# Patient Record
Sex: Male | Born: 1960 | Race: Black or African American | Hispanic: No | Marital: Single | Smoking: Former smoker
Health system: Southern US, Community
[De-identification: ages and names within clinical notes are randomized; demographics above are authoritative.]

## PROBLEM LIST (undated history)

## (undated) DIAGNOSIS — E119 Type 2 diabetes mellitus without complications: Secondary | ICD-10-CM

## (undated) DIAGNOSIS — E785 Hyperlipidemia, unspecified: Secondary | ICD-10-CM

## (undated) DIAGNOSIS — Z720 Tobacco use: Secondary | ICD-10-CM

## (undated) DIAGNOSIS — I10 Essential (primary) hypertension: Secondary | ICD-10-CM

## (undated) HISTORY — DX: Essential (primary) hypertension: I10

## (undated) HISTORY — DX: Tobacco use: Z72.0

## (undated) HISTORY — DX: Hyperlipidemia, unspecified: E78.5

## (undated) HISTORY — PX: HERNIA REPAIR: SHX51

## (undated) HISTORY — DX: Type 2 diabetes mellitus without complications: E11.9

---

## 1994-06-03 DIAGNOSIS — E1165 Type 2 diabetes mellitus with hyperglycemia: Secondary | ICD-10-CM | POA: Insufficient documentation

## 2000-10-16 ENCOUNTER — Emergency Department (HOSPITAL_COMMUNITY): Admission: EM | Admit: 2000-10-16 | Discharge: 2000-10-16 | Payer: Self-pay | Admitting: Emergency Medicine

## 2001-02-21 ENCOUNTER — Encounter: Payer: Self-pay | Admitting: Emergency Medicine

## 2001-02-21 ENCOUNTER — Emergency Department (HOSPITAL_COMMUNITY): Admission: EM | Admit: 2001-02-21 | Discharge: 2001-02-21 | Payer: Self-pay | Admitting: Emergency Medicine

## 2002-04-29 ENCOUNTER — Encounter: Payer: Self-pay | Admitting: Otolaryngology

## 2002-04-29 ENCOUNTER — Observation Stay (HOSPITAL_COMMUNITY): Admission: EM | Admit: 2002-04-29 | Discharge: 2002-04-30 | Payer: Self-pay

## 2002-06-28 ENCOUNTER — Ambulatory Visit (HOSPITAL_COMMUNITY): Admission: RE | Admit: 2002-06-28 | Discharge: 2002-06-28 | Payer: Self-pay | Admitting: Internal Medicine

## 2002-06-28 ENCOUNTER — Encounter: Payer: Self-pay | Admitting: Internal Medicine

## 2003-07-19 ENCOUNTER — Emergency Department (HOSPITAL_COMMUNITY): Admission: EM | Admit: 2003-07-19 | Discharge: 2003-07-19 | Payer: Self-pay | Admitting: Family Medicine

## 2004-10-26 ENCOUNTER — Emergency Department (HOSPITAL_COMMUNITY): Admission: EM | Admit: 2004-10-26 | Discharge: 2004-10-26 | Payer: Self-pay | Admitting: Family Medicine

## 2004-11-13 ENCOUNTER — Emergency Department (HOSPITAL_COMMUNITY): Admission: EM | Admit: 2004-11-13 | Discharge: 2004-11-13 | Payer: Self-pay | Admitting: Family Medicine

## 2005-03-19 ENCOUNTER — Emergency Department (HOSPITAL_COMMUNITY): Admission: EM | Admit: 2005-03-19 | Discharge: 2005-03-19 | Payer: Self-pay | Admitting: Family Medicine

## 2005-06-16 ENCOUNTER — Emergency Department (HOSPITAL_COMMUNITY): Admission: EM | Admit: 2005-06-16 | Discharge: 2005-06-16 | Payer: Self-pay | Admitting: Emergency Medicine

## 2005-09-03 ENCOUNTER — Emergency Department (HOSPITAL_COMMUNITY): Admission: EM | Admit: 2005-09-03 | Discharge: 2005-09-03 | Payer: Self-pay | Admitting: Family Medicine

## 2005-11-05 ENCOUNTER — Ambulatory Visit: Payer: Self-pay | Admitting: Family Medicine

## 2005-11-06 ENCOUNTER — Ambulatory Visit: Payer: Self-pay | Admitting: *Deleted

## 2006-02-20 ENCOUNTER — Emergency Department (HOSPITAL_COMMUNITY): Admission: EM | Admit: 2006-02-20 | Discharge: 2006-02-20 | Payer: Self-pay | Admitting: Emergency Medicine

## 2006-04-08 ENCOUNTER — Emergency Department (HOSPITAL_COMMUNITY): Admission: EM | Admit: 2006-04-08 | Discharge: 2006-04-08 | Payer: Self-pay | Admitting: Family Medicine

## 2006-04-09 ENCOUNTER — Emergency Department (HOSPITAL_COMMUNITY): Admission: EM | Admit: 2006-04-09 | Discharge: 2006-04-09 | Payer: Self-pay | Admitting: Emergency Medicine

## 2006-05-28 ENCOUNTER — Ambulatory Visit: Payer: Self-pay | Admitting: Internal Medicine

## 2006-05-28 ENCOUNTER — Encounter (INDEPENDENT_AMBULATORY_CARE_PROVIDER_SITE_OTHER): Payer: Self-pay | Admitting: Infectious Diseases

## 2006-05-28 LAB — CONVERTED CEMR LAB
ALT: 22 units/L (ref 0–53)
AST: 9 units/L (ref 0–37)
Albumin: 4.2 g/dL (ref 3.5–5.2)
Alkaline Phosphatase: 152 units/L — ABNORMAL HIGH (ref 39–117)
BUN: 13 mg/dL (ref 6–23)
CO2: 27 meq/L (ref 19–32)
Calcium: 9.9 mg/dL (ref 8.4–10.5)
Chloride: 99 meq/L (ref 96–112)
Creatinine, Ser: 0.86 mg/dL (ref 0.40–1.50)
Creatinine, Urine: 50.1 mg/dL
Glucose, Bld: 398 mg/dL — ABNORMAL HIGH (ref 70–99)
HCT: 44.3 % (ref 41.0–49.0)
Hemoglobin: 14.3 g/dL (ref 13.9–16.8)
MCHC: 32.3 g/dL — ABNORMAL LOW (ref 33.1–35.4)
MCV: 70.4 fL — ABNORMAL LOW (ref 78.8–100.0)
Microalb Creat Ratio: 65.7 mg/g — ABNORMAL HIGH (ref 0.0–30.0)
Microalb, Ur: 3.29 mg/dL — ABNORMAL HIGH (ref 0.00–1.89)
Platelets: 342 10*3/uL (ref 152–374)
Potassium: 4.1 meq/L (ref 3.5–5.3)
RBC: 6.29 M/uL — ABNORMAL HIGH (ref 4.20–5.50)
RDW: 14.2 % (ref 11.5–15.3)
Sodium: 135 meq/L (ref 135–145)
Total Bilirubin: 0.5 mg/dL (ref 0.3–1.2)
Total Protein: 7.5 g/dL (ref 6.0–8.3)
WBC: 7.7 10*3/uL (ref 3.7–10.0)

## 2006-06-03 DIAGNOSIS — I10 Essential (primary) hypertension: Secondary | ICD-10-CM | POA: Insufficient documentation

## 2006-06-16 DIAGNOSIS — Z87891 Personal history of nicotine dependence: Secondary | ICD-10-CM | POA: Insufficient documentation

## 2006-06-16 DIAGNOSIS — L255 Unspecified contact dermatitis due to plants, except food: Secondary | ICD-10-CM | POA: Insufficient documentation

## 2006-07-15 ENCOUNTER — Emergency Department (HOSPITAL_COMMUNITY): Admission: EM | Admit: 2006-07-15 | Discharge: 2006-07-15 | Payer: Self-pay | Admitting: Emergency Medicine

## 2006-07-22 ENCOUNTER — Ambulatory Visit: Payer: Self-pay | Admitting: Internal Medicine

## 2006-07-22 DIAGNOSIS — M25519 Pain in unspecified shoulder: Secondary | ICD-10-CM | POA: Insufficient documentation

## 2006-07-22 DIAGNOSIS — T783XXA Angioneurotic edema, initial encounter: Secondary | ICD-10-CM | POA: Insufficient documentation

## 2006-07-22 LAB — CONVERTED CEMR LAB: Blood Glucose, Fingerstick: 293

## 2006-07-23 ENCOUNTER — Encounter (INDEPENDENT_AMBULATORY_CARE_PROVIDER_SITE_OTHER): Payer: Self-pay | Admitting: Infectious Diseases

## 2006-07-23 ENCOUNTER — Ambulatory Visit: Payer: Self-pay | Admitting: Hospitalist

## 2006-07-23 LAB — CONVERTED CEMR LAB
ALT: 22 units/L (ref 0–53)
AST: 13 units/L (ref 0–37)
Albumin: 4.4 g/dL (ref 3.5–5.2)
Alkaline Phosphatase: 97 units/L (ref 39–117)
BUN: 13 mg/dL (ref 6–23)
CO2: 27 meq/L (ref 19–32)
Calcium: 9.6 mg/dL (ref 8.4–10.5)
Chloride: 97 meq/L (ref 96–112)
Cholesterol: 221 mg/dL — ABNORMAL HIGH (ref 0–200)
Creatinine, Ser: 0.82 mg/dL (ref 0.40–1.50)
Glucose, Bld: 230 mg/dL — ABNORMAL HIGH (ref 70–99)
HCT: 42.7 % (ref 39.0–52.0)
HDL: 38 mg/dL — ABNORMAL LOW (ref >39)
Hemoglobin: 13.8 g/dL (ref 13.0–17.0)
LDL Cholesterol: 143 mg/dL — ABNORMAL HIGH (ref 0–99)
MCHC: 32.3 g/dL (ref 30.0–36.0)
MCV: 69.9 fL — ABNORMAL LOW (ref 78.0–100.0)
Platelets: 317 10*3/uL (ref 150–400)
Potassium: 4.5 meq/L (ref 3.5–5.3)
RBC: 6.11 M/uL — ABNORMAL HIGH (ref 4.22–5.81)
RDW: 14.3 % — ABNORMAL HIGH (ref 11.5–14.0)
Sodium: 135 meq/L (ref 135–145)
Total Bilirubin: 0.5 mg/dL (ref 0.3–1.2)
Total CHOL/HDL Ratio: 5.8
Total Protein: 7.6 g/dL (ref 6.0–8.3)
Triglycerides: 198 mg/dL — ABNORMAL HIGH (ref <150)
VLDL: 40 mg/dL (ref 0–40)
WBC: 8.5 10*3/uL (ref 4.0–10.5)

## 2006-09-03 ENCOUNTER — Emergency Department (HOSPITAL_COMMUNITY): Admission: EM | Admit: 2006-09-03 | Discharge: 2006-09-03 | Payer: Self-pay | Admitting: Family Medicine

## 2006-09-30 ENCOUNTER — Encounter (INDEPENDENT_AMBULATORY_CARE_PROVIDER_SITE_OTHER): Payer: Self-pay | Admitting: Internal Medicine

## 2006-09-30 ENCOUNTER — Ambulatory Visit: Payer: Self-pay | Admitting: Internal Medicine

## 2006-09-30 DIAGNOSIS — L293 Anogenital pruritus, unspecified: Secondary | ICD-10-CM | POA: Insufficient documentation

## 2006-09-30 LAB — CONVERTED CEMR LAB
ALT: 33 units/L (ref 0–53)
AST: 18 units/L (ref 0–37)
Albumin: 4.5 g/dL (ref 3.5–5.2)
Alkaline Phosphatase: 92 units/L (ref 39–117)
BUN: 13 mg/dL (ref 6–23)
Blood Glucose, Fingerstick: 234
CO2: 25 meq/L (ref 19–32)
Calcium: 10.2 mg/dL (ref 8.4–10.5)
Chloride: 101 meq/L (ref 96–112)
Creatinine, Ser: 0.82 mg/dL (ref 0.40–1.50)
Glucose, Bld: 145 mg/dL — ABNORMAL HIGH (ref 70–99)
HCT: 44.8 % (ref 39.0–52.0)
Hemoglobin: 14.2 g/dL (ref 13.0–17.0)
Hgb A1c MFr Bld: 8.5 %
MCHC: 31.7 g/dL (ref 30.0–36.0)
MCV: 72 fL — ABNORMAL LOW (ref 78.0–100.0)
Platelets: 344 10*3/uL (ref 150–400)
Potassium: 4.4 meq/L (ref 3.5–5.3)
RBC: 6.22 M/uL — ABNORMAL HIGH (ref 4.22–5.81)
RDW: 14.5 % — ABNORMAL HIGH (ref 11.5–14.0)
Sodium: 141 meq/L (ref 135–145)
Total Bilirubin: 0.5 mg/dL (ref 0.3–1.2)
Total Protein: 8.1 g/dL (ref 6.0–8.3)
WBC: 6.7 10*3/uL (ref 4.0–10.5)

## 2006-10-03 ENCOUNTER — Encounter (INDEPENDENT_AMBULATORY_CARE_PROVIDER_SITE_OTHER): Payer: Self-pay | Admitting: Internal Medicine

## 2006-10-07 ENCOUNTER — Ambulatory Visit: Payer: Self-pay | Admitting: Internal Medicine

## 2006-10-29 ENCOUNTER — Telehealth (INDEPENDENT_AMBULATORY_CARE_PROVIDER_SITE_OTHER): Payer: Self-pay | Admitting: *Deleted

## 2006-12-26 ENCOUNTER — Telehealth (INDEPENDENT_AMBULATORY_CARE_PROVIDER_SITE_OTHER): Payer: Self-pay | Admitting: Internal Medicine

## 2006-12-31 ENCOUNTER — Encounter (INDEPENDENT_AMBULATORY_CARE_PROVIDER_SITE_OTHER): Payer: Self-pay | Admitting: Infectious Diseases

## 2006-12-31 ENCOUNTER — Encounter (INDEPENDENT_AMBULATORY_CARE_PROVIDER_SITE_OTHER): Payer: Self-pay | Admitting: Internal Medicine

## 2006-12-31 ENCOUNTER — Ambulatory Visit: Payer: Self-pay | Admitting: *Deleted

## 2006-12-31 ENCOUNTER — Ambulatory Visit (HOSPITAL_COMMUNITY): Admission: RE | Admit: 2006-12-31 | Discharge: 2006-12-31 | Payer: Self-pay | Admitting: *Deleted

## 2006-12-31 DIAGNOSIS — E785 Hyperlipidemia, unspecified: Secondary | ICD-10-CM | POA: Insufficient documentation

## 2006-12-31 DIAGNOSIS — S91309A Unspecified open wound, unspecified foot, initial encounter: Secondary | ICD-10-CM | POA: Insufficient documentation

## 2006-12-31 DIAGNOSIS — R000 Tachycardia, unspecified: Secondary | ICD-10-CM | POA: Insufficient documentation

## 2006-12-31 DIAGNOSIS — B353 Tinea pedis: Secondary | ICD-10-CM | POA: Insufficient documentation

## 2006-12-31 LAB — CONVERTED CEMR LAB
Blood Glucose, Fingerstick: 219
CO2: 25 meq/L (ref 19–32)
Creatinine, Ser: 0.9 mg/dL (ref 0.40–1.50)
Free T4: 1.34 ng/dL (ref 0.89–1.80)
Glucose, Bld: 189 mg/dL — ABNORMAL HIGH (ref 70–99)
Hgb A1c MFr Bld: 7.8 %
Microalb Creat Ratio: 35.3 mg/g — ABNORMAL HIGH (ref 0.0–30.0)
TSH: 1.093 microintl units/mL (ref 0.350–5.50)
Total Bilirubin: 0.6 mg/dL (ref 0.3–1.2)

## 2007-01-02 ENCOUNTER — Encounter (INDEPENDENT_AMBULATORY_CARE_PROVIDER_SITE_OTHER): Payer: Self-pay | Admitting: Internal Medicine

## 2007-04-24 ENCOUNTER — Encounter: Payer: Self-pay | Admitting: *Deleted

## 2007-04-28 ENCOUNTER — Telehealth: Payer: Self-pay | Admitting: *Deleted

## 2007-05-11 ENCOUNTER — Telehealth (INDEPENDENT_AMBULATORY_CARE_PROVIDER_SITE_OTHER): Payer: Self-pay | Admitting: Infectious Diseases

## 2007-05-18 ENCOUNTER — Telehealth (INDEPENDENT_AMBULATORY_CARE_PROVIDER_SITE_OTHER): Payer: Self-pay | Admitting: Infectious Diseases

## 2007-06-10 ENCOUNTER — Encounter (INDEPENDENT_AMBULATORY_CARE_PROVIDER_SITE_OTHER): Payer: Self-pay | Admitting: Infectious Diseases

## 2007-07-22 DIAGNOSIS — E113299 Type 2 diabetes mellitus with mild nonproliferative diabetic retinopathy without macular edema, unspecified eye: Secondary | ICD-10-CM | POA: Insufficient documentation

## 2007-07-22 DIAGNOSIS — E11329 Type 2 diabetes mellitus with mild nonproliferative diabetic retinopathy without macular edema: Secondary | ICD-10-CM | POA: Insufficient documentation

## 2007-10-28 ENCOUNTER — Ambulatory Visit: Payer: Self-pay | Admitting: *Deleted

## 2007-10-28 DIAGNOSIS — E1169 Type 2 diabetes mellitus with other specified complication: Secondary | ICD-10-CM | POA: Insufficient documentation

## 2007-10-28 LAB — CONVERTED CEMR LAB
Blood Glucose, Fingerstick: 129
Hgb A1c MFr Bld: 9.2 %

## 2007-10-29 ENCOUNTER — Telehealth (INDEPENDENT_AMBULATORY_CARE_PROVIDER_SITE_OTHER): Payer: Self-pay | Admitting: *Deleted

## 2007-11-04 ENCOUNTER — Ambulatory Visit: Payer: Self-pay | Admitting: Infectious Disease

## 2007-11-11 ENCOUNTER — Ambulatory Visit: Payer: Self-pay | Admitting: *Deleted

## 2007-11-11 ENCOUNTER — Encounter (INDEPENDENT_AMBULATORY_CARE_PROVIDER_SITE_OTHER): Payer: Self-pay | Admitting: Internal Medicine

## 2007-11-11 LAB — CONVERTED CEMR LAB
ALT: 38 units/L (ref 0–53)
Alkaline Phosphatase: 76 units/L (ref 39–117)
Glucose, Bld: 186 mg/dL — ABNORMAL HIGH (ref 70–99)
LDL Cholesterol: 74 mg/dL (ref 0–99)
MCHC: 30.3 g/dL (ref 30.0–36.0)
MCV: 73.3 fL — ABNORMAL LOW (ref 78.0–100.0)
Microalb, Ur: 1.26 mg/dL (ref 0.00–1.89)
Platelets: 287 10*3/uL (ref 150–400)
Sodium: 138 meq/L (ref 135–145)
Total Bilirubin: 0.4 mg/dL (ref 0.3–1.2)
Total Protein: 7.8 g/dL (ref 6.0–8.3)
Triglycerides: 155 mg/dL — ABNORMAL HIGH (ref <150)
VLDL: 31 mg/dL (ref 0–40)

## 2007-12-04 ENCOUNTER — Ambulatory Visit: Payer: Self-pay | Admitting: *Deleted

## 2007-12-04 ENCOUNTER — Telehealth (INDEPENDENT_AMBULATORY_CARE_PROVIDER_SITE_OTHER): Payer: Self-pay | Admitting: Infectious Diseases

## 2008-03-23 ENCOUNTER — Ambulatory Visit: Payer: Self-pay | Admitting: Internal Medicine

## 2008-03-23 ENCOUNTER — Encounter: Payer: Self-pay | Admitting: Internal Medicine

## 2008-03-23 DIAGNOSIS — A5601 Chlamydial cystitis and urethritis: Secondary | ICD-10-CM | POA: Insufficient documentation

## 2008-04-14 ENCOUNTER — Telehealth (INDEPENDENT_AMBULATORY_CARE_PROVIDER_SITE_OTHER): Payer: Self-pay | Admitting: *Deleted

## 2008-11-02 ENCOUNTER — Telehealth: Payer: Self-pay | Admitting: Internal Medicine

## 2008-12-07 ENCOUNTER — Ambulatory Visit: Payer: Self-pay | Admitting: Internal Medicine

## 2008-12-07 ENCOUNTER — Encounter: Payer: Self-pay | Admitting: Internal Medicine

## 2008-12-07 DIAGNOSIS — K0381 Cracked tooth: Secondary | ICD-10-CM | POA: Insufficient documentation

## 2008-12-07 LAB — CONVERTED CEMR LAB: Blood Glucose, Fingerstick: 301

## 2009-04-18 ENCOUNTER — Emergency Department (HOSPITAL_COMMUNITY): Admission: EM | Admit: 2009-04-18 | Discharge: 2009-04-18 | Payer: Self-pay | Admitting: Emergency Medicine

## 2009-05-08 ENCOUNTER — Encounter: Payer: Self-pay | Admitting: Internal Medicine

## 2009-06-01 ENCOUNTER — Telehealth: Payer: Self-pay | Admitting: Internal Medicine

## 2009-06-14 ENCOUNTER — Ambulatory Visit: Payer: Self-pay | Admitting: Internal Medicine

## 2009-06-14 ENCOUNTER — Encounter (INDEPENDENT_AMBULATORY_CARE_PROVIDER_SITE_OTHER): Payer: Self-pay | Admitting: Internal Medicine

## 2009-06-14 DIAGNOSIS — A4902 Methicillin resistant Staphylococcus aureus infection, unspecified site: Secondary | ICD-10-CM | POA: Insufficient documentation

## 2009-06-22 LAB — CONVERTED CEMR LAB
Cholesterol: 209 mg/dL — ABNORMAL HIGH (ref 0–200)
Microalb, Ur: 7.82 mg/dL — ABNORMAL HIGH (ref 0.00–1.89)
Triglycerides: 259 mg/dL — ABNORMAL HIGH (ref <150)
VLDL: 52 mg/dL — ABNORMAL HIGH (ref 0–40)

## 2009-06-30 ENCOUNTER — Telehealth (INDEPENDENT_AMBULATORY_CARE_PROVIDER_SITE_OTHER): Payer: Self-pay | Admitting: *Deleted

## 2009-07-12 ENCOUNTER — Ambulatory Visit: Payer: Self-pay | Admitting: Internal Medicine

## 2009-08-15 ENCOUNTER — Telehealth (INDEPENDENT_AMBULATORY_CARE_PROVIDER_SITE_OTHER): Payer: Self-pay | Admitting: *Deleted

## 2009-08-18 ENCOUNTER — Telehealth: Payer: Self-pay | Admitting: Internal Medicine

## 2009-10-24 ENCOUNTER — Telehealth (INDEPENDENT_AMBULATORY_CARE_PROVIDER_SITE_OTHER): Payer: Self-pay | Admitting: *Deleted

## 2009-10-24 ENCOUNTER — Encounter (INDEPENDENT_AMBULATORY_CARE_PROVIDER_SITE_OTHER): Payer: Self-pay | Admitting: *Deleted

## 2009-11-06 ENCOUNTER — Telehealth: Payer: Self-pay | Admitting: Internal Medicine

## 2010-03-23 ENCOUNTER — Telehealth: Payer: Self-pay | Admitting: Internal Medicine

## 2010-07-24 NOTE — Letter (Signed)
Summary: [facility] RECALL LETTER  All     ,     Phone:   Fax:     10/24/2009   Rodney Mcgee [address] [place], [place]  [address]   Dear  [name],   You are due to follow-up with a doctor at the [facility].  We have been unable to contact you by phone.  If you would like to schedule a visit, please call [phone].  If you are receiving your health care somewhere else, please call us and we will take your name off our patient list.  Healthy regards,  Rodney Mcgee, Director [facility]

## 2010-07-24 NOTE — Letter (Signed)
Summary: [employer] Assistance : Diabetes Care   [employer] Assistance : Diabetes Care   Imported By: Florinda Marker 07/13/2009 15:17:31  _____________________________________________________________________  External Attachment:    Type:   Image     Comment:   External Document

## 2010-07-24 NOTE — Progress Notes (Signed)
Summary: med refill/gp  Phone Note Refill Request Message from:  Fax from Pharmacy on August 18, 2009 4:25 PM  Refills Requested: Medication #1:  GLYBURIDE 5 MG TABS Take 2 tablets by mouth two times a day   Last Refilled: 07/12/2009  Method Requested: Electronic Initial call taken by: Chinita Pester RN,  August 18, 2009 4:25 PM  Follow-up for Phone Call        pls make pt appt for followup.     Prescriptions: GLYBURIDE 5 MG TABS (GLYBURIDE) Take 2 tablets by mouth two times a day  #60 x 1   Entered and Authorized by:   Clerance Lav MD   Signed by:   Clerance Lav MD on 08/18/2009   Method used:   Electronically to        [facility] [phone]* (retail)       [address]       [place], [place]  [address]       Ph: [phone]       Fax: [phone]   RxID:   [id number]   Appended Document: med refill/gp Flag sent to [facility] for an appt.

## 2010-07-24 NOTE — Progress Notes (Signed)
Summary: reschdeuled appmnt for diabetes self management training/dmr  Phone Note Outgoing Call   Call placed by: Jamison Neighbor RD,CDE,  June 30, 2009 2:19 PM Summary of Call: called patient about missed appintment for diabetes self management training.  was told I could call his cell # [phone]. Patient rescheduled for 07/12/09 @ 10 am.

## 2010-07-24 NOTE — Progress Notes (Signed)
Summary: refill/gg  Phone Note Refill Request  on March 23, 2010 4:32 PM  Refills Requested: Medication #1:  GLYBURIDE 5 MG TABS Take 2 tablets by mouth two times a day   Last Refilled: 02/08/2010 Will you change quantity to # 120?   Method Requested: Electronic Initial call taken by: Merrie Roof RN,  March 23, 2010 4:33 PM  Follow-up for Phone Call        Please refer to refill request addressed by Dr. Sherryll Burger on 11/06/09.  Pt needs appointment.  Follow-up by: Mariea Stable MD,  March 29, 2010 4:34 PM    Prescriptions: GLYBURIDE 5 MG TABS (GLYBURIDE) Take 2 tablets by mouth two times a day  #120 x 3   Entered and Authorized by:   Clerance Lav MD   Signed by:   Clerance Lav MD on 03/30/2010   Method used:   Electronically to        [facility]  [address] [phone]* (retail)       [address]       [place], [place]  [address]       Ph: [phone]       Fax: [phone]   RxID:   [id number]

## 2010-07-24 NOTE — Progress Notes (Signed)
Summary: Refill/gh  Phone Note Refill Request Message from:  Patient on Nov 06, 2009 3:50 PM  Refills Requested: Medication #1:  GLYBURIDE 5 MG TABS Take 2 tablets by mouth two times a day   Last Refilled: 08/18/2009  Method Requested: Electronic Initial call taken by: Angelina Ok RN,  Nov 06, 2009 3:50 PM  Follow-up for Phone Call        pt needs appt. if pt is calling for refill, they must have reachable number. I will refill for last time, if pt is not coming back for appt, I will stop refills.  Follow-up by: Clerance Lav MD,  Nov 09, 2009 9:32 AM    Prescriptions: GLYBURIDE 5 MG TABS (GLYBURIDE) Take 2 tablets by mouth two times a day  #60 x 3   Entered and Authorized by:   Clerance Lav MD   Signed by:   Clerance Lav MD on 11/09/2009   Method used:   Electronically to        [facility] [phone]* (retail)       [address]       [place], [place]  [address]       Ph: [phone]       Fax: [phone]   RxID:   [id number]

## 2010-07-24 NOTE — Progress Notes (Signed)
  Phone Note Outgoing Call   Call placed by: Jamison Neighbor RD,CDE,  Oct 24, 2009 10:34 AM Summary of Call: phone number listed is not in service. will mail letter reminding patient he is due for an appointment with doctor.

## 2010-07-24 NOTE — Assessment & Plan Note (Signed)
Summary: diabetes/rescheduled from earlier apptmet/dmr   Preventive Screening-Counseling & Management  Alcohol-Tobacco     Smoking Status: Quit < 1 Year  Allergies: 1)  ! Pcn 2)  ! Asa 3)  ! * Lisinopril  Social History: Smoking Status:  Quit < 1 Year   Complete Medication List: 1)  Metformin Hcl 1000 Mg Tabs (Metformin hcl) .... Take 1 tablet by mouth two times a day 2)  Glyburide 5 Mg Tabs (Glyburide) .... Take 2 tablets by mouth two times a day 3)  Chlorhexidine Gluconate 2 % Liqd (Chlorhexidine gluconate) .... Apply solution from neck to toes leave on for one minute and then rinse for 7 [place]. 4)  Mupirocin 2 % Oint (Mupirocin) .... Apply to both nostrils two times a day for 7 [place]. 5)  Pravachol 40 Mg Tabs (Pravastatin sodium) .... Take one tablet daily to decrease cholesterol.  Other Orders: DSMT(Medicare) Individual, 30 Minutes (G0108)  Diabetes Self Management Training  PCP: Clerance Lav MD Date diagnosed with diabetes: 06/25/1999 Diabetes Type: Type 2 non-insulin Current smoking Status: Quit < 1 Year  Assessment Work Hours: Not currently working Daily activities: trying to walk daily Sources of Support: children and spouse Special needs or Barriers: finaincil  Potential Barriers  Economic/Supplies  Needs review/assistance  Coping with Diabetes Feelings about Diabetes: discussed that he felt he was in denial, but is ready to tkae care of hsi diabetes now  Diabetes Medications:  Comments: gave patient a freestyle lite meter today- CBG was > 350 in office. patient has been out of both his diabetes medications since last week and tells me he has also made many changes in his diet to decrease sugar and starch. . plans to get them refiled today and start on them. says that if his blood sugars arenot downl by next week- he'll call us.advised him to purchase [employer] meter if he does not qualify for patient assistance for this meter. Discussed consideration of insulin  today- one injection of Nph at bedtime or ReliOn Humulin 70/30 before dinner may help pills work better and patient to affordably reach target blood sugars    Monitoring Self monitoring blood glucose 1 time a day Name of Meter  Freestyle Lite  Recent Episodes of: Requiring Help from another person  Hyperglycemia : Yes Hypoglycemia: No Severe Hypoglycemia : No     Estimated /Usual Carb Intake Breakfast # of Carbs/Grams cereal and 1% milk or egg and grits and sausage Lunch # of Carbs/Grams sadwich and diet soda or water Midafternoon # of Carbs/Grams fruit or yogurt Dinner # of Carbs/Grams meat- not fired, starch and vegetable- if he eats bread- he leaves starch off  Nutrition assessment Who does the food shopping? Your spouse Who does the cooking?  Your spouse What beverages do you drink?  water, diet soda Do you read food labels?                                                                          No- not often , but sometimes Biggest challenge to eating healthy: Eating too little Diabetes Disease Process  Discussed today State own type of diabetes: Demonstrates competencyState diabetes is treated by meal plan-exercise-medication-monitoring-education: Demonstrates competency Medications State name-action-dose-duration-side effects-and time  to take medication: Needs review/assistanceDescribe safe needle/lancet disposal: Demonstrates competency Nutritional Management Identify what foods most often affect blood glucose: Needs review/assistance    Verbalize importance of controlling food portions: Demonstrates competencyState importance of spacing and not omitting meals and snacks: Demonstrates competencyState changes planned for home meals/snacks: Needs review/assistance    Monitoring State purpose and frequency of monitoring BG-ketones-HgbA1C  : Needs review/assistance   Perform glucose monitoring/ketone testing and record results correctly: Demonstrates competencyState  target blood glucose and HgbA1C goals: Needs review/assistance Complications State the causes-signs and symptoms and prevention of Hyperglycemia: Needs review/assistance   Explain proper treatment of hyperglycemia: Needs review/assistance   Glucose control and the development/prevention of long-term complications: Demonstrates competency Exercise States importance of exercise: Demonstrates competencyStates effect of exercise on blood glucose: Demonstrates competencyVerbalizes safety measures for exercise related to diabetes: Needs review/assistance Lifestyle changes:Goal setting and Problem solving State benefits of making appropriate lifestyle changes: Demonstrates competencyIdentify lifestyle behaviors that need to change: Demonstrates competencyDevelop strategies to reduce risk factors: Needs review/assistanceIdentify Family/SO role in managing diabetes: Demonstrates competency    Psychosocial Adjustment State three common feelings that might be experienced when learning to cope with diabetes: Demonstrates competency   Identify two methods to cope with these feelings: Needs review/assistanceDiabetes Management Education Done: 07/05/2009    BEHAVIORAL GOALS INITIAL Incorporating physical activity into lifestyle: walk 30 minutes every day Monitoring blood glucose levels daily: check blood sugar daily for next 10 [place]        suggest inulin initiation as soon as patient ready and willing. did not want ot schedule a follow-up with CDE. advised 4 weeks, but patient is uninsured. could refer him to [facility]. Patient givien [employer] phone number ot follow up on test strip assistance application.  Diabetes Self Management Support: wife, clinic staff Follow-up:receommended 4 weeks

## 2010-07-24 NOTE — Progress Notes (Signed)
Summary: diabetes support and follow-up/dmr  Phone Note Outgoing Call   Call placed by: Jamison Neighbor RD,CDE,  August 15, 2009 3:16 PM Summary of Call: called to follow-upon blood sugars. patient is due to follow-up with Doctor in March. phone number is incorrect. will  request patient be contactedl to schedule appointment.

## 2010-07-24 NOTE — Progress Notes (Signed)
Summary: Refill/gh  Phone Note Refill Request Message from:  Fax from Pharmacy on Nov 06, 2009 12:41 PM  Refills Requested: Medication #1:  METFORMIN HCL 1000 MG  TABS Take 1 tablet by mouth two times a day   Last Refilled: 09/16/2009  Method Requested: Electronic Initial call taken by: Angelina Ok RN,  Nov 06, 2009 12:41 PM  Follow-up for Phone Call       Follow-up by: Clerance Lav MD,  Nov 06, 2009 12:52 PM    Prescriptions: METFORMIN HCL 1000 MG  TABS (METFORMIN HCL) Take 1 tablet by mouth two times a day  #60 x 3   Entered and Authorized by:   Clerance Lav MD   Signed by:   Clerance Lav MD on 11/06/2009   Method used:   Electronically to        [facility] [phone]* (retail)       [address]       [place], [place]  [address]       Ph: [phone]       Fax: [phone]   RxID:   [id number]

## 2010-10-01 ENCOUNTER — Encounter: Payer: Self-pay | Admitting: Internal Medicine

## 2010-10-01 ENCOUNTER — Ambulatory Visit (INDEPENDENT_AMBULATORY_CARE_PROVIDER_SITE_OTHER): Payer: Self-pay | Admitting: Internal Medicine

## 2010-10-01 DIAGNOSIS — Z23 Encounter for immunization: Secondary | ICD-10-CM

## 2010-10-01 DIAGNOSIS — N529 Male erectile dysfunction, unspecified: Secondary | ICD-10-CM

## 2010-10-01 DIAGNOSIS — E1169 Type 2 diabetes mellitus with other specified complication: Secondary | ICD-10-CM

## 2010-10-01 DIAGNOSIS — I10 Essential (primary) hypertension: Secondary | ICD-10-CM

## 2010-10-01 DIAGNOSIS — J302 Other seasonal allergic rhinitis: Secondary | ICD-10-CM | POA: Insufficient documentation

## 2010-10-01 DIAGNOSIS — N521 Erectile dysfunction due to diseases classified elsewhere: Secondary | ICD-10-CM | POA: Insufficient documentation

## 2010-10-01 DIAGNOSIS — N485 Ulcer of penis: Secondary | ICD-10-CM | POA: Insufficient documentation

## 2010-10-01 DIAGNOSIS — E119 Type 2 diabetes mellitus without complications: Secondary | ICD-10-CM

## 2010-10-01 DIAGNOSIS — E785 Hyperlipidemia, unspecified: Secondary | ICD-10-CM

## 2010-10-01 DIAGNOSIS — N4889 Other specified disorders of penis: Secondary | ICD-10-CM

## 2010-10-01 DIAGNOSIS — J309 Allergic rhinitis, unspecified: Secondary | ICD-10-CM

## 2010-10-01 LAB — COMPREHENSIVE METABOLIC PANEL
AST: 19 U/L (ref 0–37)
BUN: 12 mg/dL (ref 6–23)
CO2: 25 mEq/L (ref 19–32)
Calcium: 10 mg/dL (ref 8.4–10.5)
Chloride: 99 mEq/L (ref 96–112)
Creat: 0.8 mg/dL (ref 0.40–1.50)

## 2010-10-01 LAB — GLUCOSE, CAPILLARY: Glucose-Capillary: 301 mg/dL — ABNORMAL HIGH (ref 70–99)

## 2010-10-01 LAB — RPR

## 2010-10-01 MED ORDER — METFORMIN HCL 1000 MG PO TABS: 1000.0000 mg | ORAL_TABLET | Freq: Two times a day (BID) | ORAL | Status: DC

## 2010-10-01 MED ORDER — CETIRIZINE-PSEUDOEPHEDRINE ER 5-120 MG PO TB12: 1.0000 | ORAL_TABLET | Freq: Two times a day (BID) | ORAL | Status: AC

## 2010-10-01 MED ORDER — GLIPIZIDE 10 MG PO TABS: 10.0000 mg | ORAL_TABLET | Freq: Two times a day (BID) | ORAL | Status: DC

## 2010-10-01 MED ORDER — PRAVASTATIN SODIUM 40 MG PO TABS: 40.0000 mg | ORAL_TABLET | Freq: Every day | ORAL | Status: DC

## 2010-10-01 MED ORDER — SILDENAFIL CITRATE 50 MG PO TABS: 50.0000 mg | ORAL_TABLET | ORAL | Status: DC | PRN

## 2010-10-01 MED ORDER — MUPIROCIN CALCIUM 2 % EX CREA: TOPICAL_CREAM | CUTANEOUS | Status: AC

## 2010-10-01 MED ORDER — DOXYCYCLINE HYCLATE 100 MG PO TABS: 100.0000 mg | ORAL_TABLET | Freq: Two times a day (BID) | ORAL | Status: AC

## 2010-10-01 NOTE — Assessment & Plan Note (Signed)
Cannot recollect when he was immunized last time for his tetanus. Given that he is opening his foot. I am immunized in today with TD vaccine.

## 2010-10-01 NOTE — Progress Notes (Signed)
  Subjective:    Patient ID: Rodney Mcgee, male    DOB: 02/18/1961, 50 y.o.   MRN: [id number]  HPI Patient is a 50 year old male who is came to the clinic after one year of hiatus. Patient is known to have type 2 diabetes, hypertension, history of Chlamydia urethritis. He is year with several complaints.  #1 right great toe ulcer-she reports that he started having pain and discharge in the area after eating something approximately one week ago. He reports that he first started having pain in the foot started swelling. Swelling was mainly related to the right great toe nailbed. Describes his pain as throbbing. He describes that there was a puslike discharge. Today he had a bloody discharge from the area. He reports that all of discharge has drained. He denies any fevers nausea vomiting or chills.  #2 stuff nose and difficulty breathing patient reports its he is having hard time breathing due to nasal congestion. Started having symptoms about 2 weeks ago. He has not taken anything for it. He generally suffers from the seasonal allergies.   #3 patient reports having erectile dysfunction. He reports that he is not able to achieve erection. It's intermittent. His personal life is very compromised because of the situation. He wonders if this is related to his diabetes.  #4 is not compliant with his medications. He has intermittently taken his medications over the last 6 months.  #5 he reports that he is having difficulty retracting the foreskin of the penile area. He reports that he has a cracked skin around the urethral meatus. He denies having any ulcer or discharge. He denies any warts in the area in the past.    Review of Systems  All other systems reviewed and are negative.       Objective:   Physical Exam  Constitutional: He appears well-developed and well-nourished. No distress.  HENT:  Head: Normocephalic and atraumatic.  Right Ear: External ear normal.  Left Ear: External ear normal.    Nose: Mucosal edema, rhinorrhea and sinus tenderness present.  Eyes: Conjunctivae and EOM are normal. Pupils are equal, round, and reactive to light.  Neck: Normal range of motion. Neck supple. No tracheal deviation present. No thyromegaly present.  Cardiovascular: Normal rate, regular rhythm and normal heart sounds.   Pulmonary/Chest: Breath sounds normal. No respiratory distress. He has no wheezes.  Abdominal: Soft. Bowel sounds are normal. He exhibits no distension. There is no tenderness. There is no rebound.  Genitourinary: Testes normal.    Cremasteric reflex is present. Phimosis and penile tenderness present. No penile erythema. No discharge found.  Skin: He is not diaphoretic.          Assessment & Plan:

## 2010-10-01 NOTE — Assessment & Plan Note (Signed)
I would give him a trial of Viagra. I discussed with him at length that he is director dysfunction would be limited to diabetes. Only way to prevent further progress is to control his diabetes.

## 2010-10-01 NOTE — Patient Instructions (Signed)
Return in one week. Take all your medications as prescribed. If your foot starts swelling up and has continued discharge in 3 [place], please call us back. You are prescribed several new medications, take them as prescribed.

## 2010-10-01 NOTE — Assessment & Plan Note (Signed)
He is noncompliant with his medications. His A1c is elevated today. At this time I will start him back on his oral medications. I have urged him to be compliant with his medications. He might need to go on insulin. At this time I will defer that decision. He is known to have nonproliferative diabetic retinopathy. He has not gone back for his eye exams. His foot was examined today. He has diabetic foot ulcer. He is known to have proteinuria. He cannot tolerate ACE inhibitor. Continue to follow in 1 week.

## 2010-10-01 NOTE — Assessment & Plan Note (Signed)
His blood pressure is elevated today. He carries a diagnosis of hypertension in the past. His were pressure on recent visits have been normal. I wonder if this is related to the pain. Recheck her pressure on next visit. Consider starting him on her trazodone next visit.

## 2010-10-01 NOTE — Assessment & Plan Note (Signed)
Started him on antihistamine with decongestant. Follow up in one week

## 2010-10-01 NOTE — Assessment & Plan Note (Signed)
Will need FLP soon. Too many issues for today's visit, so will re prescribe it for now.

## 2010-10-01 NOTE — Assessment & Plan Note (Signed)
His diabetic foot ulcer appears to be healing. He does have some discharge in the area. However I do not see any significant amount of abscess formation. I will start him on oral doxycycline. He'll also plan to [facility] into the area given that he is history of MRSA infections. Follow up in one week. He is instructed to call back if the swelling becomes generalized or that he starts having fevers

## 2010-10-02 LAB — CBC WITH DIFFERENTIAL/PLATELET
HCT: 42.7 % (ref 39.0–52.0)
Hemoglobin: 13.5 g/dL (ref 13.0–17.0)
Lymphocytes Relative: 46 % (ref 12–46)
Lymphs Abs: 3 10*3/uL (ref 0.7–4.0)
Monocytes Absolute: 0.4 10*3/uL (ref 0.1–1.0)
Monocytes Relative: 7 % (ref 3–12)
Neutro Abs: 2.5 10*3/uL (ref 1.7–7.7)
RBC: 6.07 MIL/uL — ABNORMAL HIGH (ref 4.22–5.81)
WBC: 6.5 10*3/uL (ref 4.0–10.5)

## 2010-10-02 LAB — GC/CHLAMYDIA PROBE AMP, URINE
Chlamydia, Swab/Urine, PCR: NEGATIVE
GC Probe Amp, Urine: NEGATIVE

## 2010-10-08 ENCOUNTER — Ambulatory Visit: Payer: Self-pay | Admitting: Internal Medicine

## 2010-10-22 ENCOUNTER — Other Ambulatory Visit: Payer: Self-pay | Admitting: Internal Medicine

## 2010-10-22 ENCOUNTER — Encounter: Payer: Self-pay | Admitting: Internal Medicine

## 2010-10-22 ENCOUNTER — Ambulatory Visit (INDEPENDENT_AMBULATORY_CARE_PROVIDER_SITE_OTHER): Payer: Self-pay | Admitting: Internal Medicine

## 2010-10-22 VITALS — BP 141/96 | HR 88 | Temp 96.7°F | Ht 66.0 in | Wt 190.2 lb

## 2010-10-22 DIAGNOSIS — J309 Allergic rhinitis, unspecified: Secondary | ICD-10-CM

## 2010-10-22 DIAGNOSIS — I10 Essential (primary) hypertension: Secondary | ICD-10-CM

## 2010-10-22 DIAGNOSIS — E119 Type 2 diabetes mellitus without complications: Secondary | ICD-10-CM

## 2010-10-22 DIAGNOSIS — E1169 Type 2 diabetes mellitus with other specified complication: Secondary | ICD-10-CM

## 2010-10-22 DIAGNOSIS — J302 Other seasonal allergic rhinitis: Secondary | ICD-10-CM

## 2010-10-22 MED ORDER — HYDROCHLOROTHIAZIDE 12.5 MG PO TABS: 12.5000 mg | ORAL_TABLET | Freq: Every day | ORAL | Status: DC

## 2010-10-22 NOTE — Assessment & Plan Note (Signed)
BP still elevated. Started him on hctz, dose may be escalated on next visit if BP not well controlled.

## 2010-10-22 NOTE — Progress Notes (Signed)
  Subjective:    Patient ID: Rodney Mcgee, male    DOB: 1960-11-20, 50 y.o.   MRN: [id number]  Diabetes  Foot Injury    Patient is a 50 year old male who is came to the clinic after one year of hiatus. Patient is known to have type 2 diabetes, hypertension, history of Chlamydia urethritis. He is year with several complaints.  #1 right great toe ulcer-healing, completing doxycycline, no discharge, feeling better #2 stuff nose and difficulty breathing patient is still having this symptoms, could not afford zyrtec D #3 patient reports having erectile dysfunction. Could not afford viagra, has decreased alcohol intake  #4 is not compliant with his medications. Encouraged compliance again today  #5 he reports that he is having difficulty retracting the foreskin of the penile area. Workup is negative. May need urology referral if having difficulty with intercourse.     Review of Systems  All other systems reviewed and are negative.       Objective:   Physical Exam  Constitutional: He appears well-developed and well-nourished. No distress.  HENT:  Head: Normocephalic and atraumatic.  Right Ear: External ear normal.  Left Ear: External ear normal.  Nose: Mucosal edema, rhinorrhea and sinus tenderness present.  Eyes: Conjunctivae and EOM are normal. Pupils are equal, round, and reactive to light.  Neck: Normal range of motion. Neck supple. No tracheal deviation present. No thyromegaly present.  Cardiovascular: Normal rate, regular rhythm and normal heart sounds.   Pulmonary/Chest: Breath sounds normal. No respiratory distress. He has no wheezes.  Abdominal: Soft. Bowel sounds are normal. He exhibits no distension. There is no tenderness. There is no rebound.  Genitourinary: Testes normal.    Cremasteric reflex is present. Phimosis and penile tenderness present. No penile erythema. No discharge found.  Skin: He is not diaphoretic.          Assessment & Plan:

## 2010-10-22 NOTE — Assessment & Plan Note (Signed)
Asked him to take over the counter antihistamine and decongestant. Patient agrees.

## 2010-10-22 NOTE — Patient Instructions (Signed)
Return one month Buy Cetirizine over the counter with or without decongestant for your nasal congestion. Finish your antibiotics You are started on new blood pressure pills- start taking them.

## 2010-10-22 NOTE — Assessment & Plan Note (Signed)
Continue current regimen. May need eventually to go on to insulin

## 2010-10-22 NOTE — Assessment & Plan Note (Signed)
Appears healed. Asked him to finish his antibiotic course

## 2010-11-09 NOTE — H&P (Signed)
NAME:  Rodney Mcgee, Rodney Mcgee NO.:  [id number]   MEDICAL RECORD NO.:  [id number]                   PATIENT TYPE:  EMS   LOCATION:  [facility]                                 FACILITY:  [facility]   PHYSICIAN:  Hermelinda Medicus, M.D.                DATE OF BIRTH:  01/24/1961   DATE OF ADMISSION:  04/29/2002  DATE OF DISCHARGE:                                HISTORY & PHYSICAL   HISTORY OF PRESENT ILLNESS:  This patient is a 50 year old male who takes  two medications, takes Glucotrol XL 5 mg, two tablets per day, and also  takes an ACE inhibitor.  He came in this afternoon with some considerable  tongue swelling.  His airway was not obstructed, however, he did have  considerable edema of his tongue with partial airway blockage and difficulty  speaking.  He was observed by Dr. Pearletha Alfred and given racemic  epinephrine treatment as well as a steroid, Decadron 10 mg IV.  He has  improved.  He has no voice abnormality, no laryngeal edema but his tongue is  not totally blocking his mouth but is still somewhat enlarged.  We felt that  we will observe him overnight in the hospital, continue him on IV fluids and  I have talked to Dr. Lovenia Kim, where he will not be using any  further ACE inhibitors.  We will not put him on any blood pressure  medication until he has resolved this problem.  His laboratory reveals a  glucose which is elevated because of the IV, and his blood gases are within  normal status.  Hematocrit was stated at 54, hemoglobin of 18.  His blood  pressure is 131/74.  He was also given Pepcid 20 mg and Benadryl 50 mg IV  and he was given epinephrine subcuticular 0.5 mg.   REVIEW OF SYSTEMS:  His review of systems is essentially quite unremarkable.  He has no neck, ENT, eye, musculoskeletal or skin problems.  His glucose has  been elevated at 373.  When he was seen on September 07, 2001, his alkaline  phosphatase was 128 and his SGOT/SGPT was 59, total  protein 8.7,  triglycerides 568.  He was treated with Glucotrol XL 5 mg two tablets per  day and Vasotec 5 mg every day, which he is to be off completely.   ALLERGIES:  He has got an ASPIRIN allergy.   PHYSICAL EXAMINATION:  VITAL SIGNS:  On physical examination, his blood  pressure was 131/74.  HEENT:  His ears are clear.  Tympanic membranes are clear.  Oral cavity  shows a quite large tongue but not airway compromise.  His larynx is hard to  see but his voice is good and he has no evidence of laryngeal edema; we have  a pending CAT scan.  NECK:  His neck is free of any thyromegaly, cervical adenopathy or  mass.  CHEST:  Chest is clear.  No rales, rhonchi or wheezes.  CARDIOVASCULAR:  No opening snaps, murmurs or gallops.  ABDOMEN:  Abdomen mildly obese.  GENERAL:  He weighs 200.  He is 50 years old and approximately 5 feet 6  inches.   INITIAL DIAGNOSIS:  ACE inhibitor response with an allergy with tongue  edema, with history of blood pressure elevation, history of diabetic status,  non-insulin dependent, Glucotrol XL 5 mg two tablets per day.    PLAN:  We will observe, have tracheostomy set in the room, keep him on  intravenous fluids and observe him carefully.                                               Hermelinda Medicus, M.D.    JC/MEDQ  D:  04/29/2002  T:  04/30/2002  Job:  [id number]   cc:   Pearletha Alfred, M.D.  [address]  [place] [address]  Fax: [phone]   Lovenia Kim, D.O.

## 2011-03-25 LAB — GLUCOSE, CAPILLARY: Glucose-Capillary: 236 — ABNORMAL HIGH

## 2011-07-08 ENCOUNTER — Encounter: Payer: Self-pay | Admitting: Internal Medicine

## 2011-10-30 ENCOUNTER — Other Ambulatory Visit: Payer: Self-pay | Admitting: Internal Medicine

## 2011-11-01 NOTE — Telephone Encounter (Signed)
Pharmacy aware - denied per Dr Larey Seat. Talked with pt - appt made.

## 2011-11-07 ENCOUNTER — Encounter: Payer: Self-pay | Admitting: Internal Medicine

## 2011-11-07 ENCOUNTER — Ambulatory Visit (INDEPENDENT_AMBULATORY_CARE_PROVIDER_SITE_OTHER): Payer: Self-pay | Admitting: Internal Medicine

## 2011-11-07 VITALS — BP 142/90 | HR 92 | Temp 97.5°F | Resp 20 | Ht 66.0 in | Wt 182.4 lb

## 2011-11-07 DIAGNOSIS — Z79899 Other long term (current) drug therapy: Secondary | ICD-10-CM

## 2011-11-07 DIAGNOSIS — E11329 Type 2 diabetes mellitus with mild nonproliferative diabetic retinopathy without macular edema: Secondary | ICD-10-CM

## 2011-11-07 DIAGNOSIS — E785 Hyperlipidemia, unspecified: Secondary | ICD-10-CM

## 2011-11-07 DIAGNOSIS — H579 Unspecified disorder of eye and adnexa: Secondary | ICD-10-CM

## 2011-11-07 DIAGNOSIS — Z23 Encounter for immunization: Secondary | ICD-10-CM

## 2011-11-07 DIAGNOSIS — A549 Gonococcal infection, unspecified: Secondary | ICD-10-CM | POA: Insufficient documentation

## 2011-11-07 DIAGNOSIS — F17200 Nicotine dependence, unspecified, uncomplicated: Secondary | ICD-10-CM

## 2011-11-07 DIAGNOSIS — Z118 Encounter for screening for other infectious and parasitic diseases: Secondary | ICD-10-CM

## 2011-11-07 DIAGNOSIS — E119 Type 2 diabetes mellitus without complications: Secondary | ICD-10-CM

## 2011-11-07 DIAGNOSIS — I10 Essential (primary) hypertension: Secondary | ICD-10-CM

## 2011-11-07 DIAGNOSIS — E1139 Type 2 diabetes mellitus with other diabetic ophthalmic complication: Secondary | ICD-10-CM

## 2011-11-07 DIAGNOSIS — Z113 Encounter for screening for infections with a predominantly sexual mode of transmission: Secondary | ICD-10-CM

## 2011-11-07 LAB — LIPID PANEL
Cholesterol: 199 mg/dL (ref 0–200)
HDL: 33 mg/dL — ABNORMAL LOW (ref >39)
LDL Cholesterol: 144 mg/dL — ABNORMAL HIGH (ref 0–99)
Triglycerides: 111 mg/dL (ref <150)
VLDL: 22 mg/dL (ref 0–40)

## 2011-11-07 LAB — COMPLETE METABOLIC PANEL WITH GFR
ALT: 24 U/L (ref 0–53)
AST: 18 U/L (ref 0–37)
BUN: 12 mg/dL (ref 6–23)
Calcium: 9.4 mg/dL (ref 8.4–10.5)
Chloride: 102 mEq/L (ref 96–112)
Creat: 0.73 mg/dL (ref 0.50–1.35)
GFR, Est African American: >89 mL/min
Total Bilirubin: 0.4 mg/dL (ref 0.3–1.2)

## 2011-11-07 LAB — GLUCOSE, CAPILLARY: Glucose-Capillary: 234 mg/dL — ABNORMAL HIGH (ref 70–99)

## 2011-11-07 MED ORDER — HYDROCHLOROTHIAZIDE 12.5 MG PO TABS: 12.5000 mg | ORAL_TABLET | Freq: Every day | ORAL | Status: DC

## 2011-11-07 MED ORDER — METFORMIN HCL 1000 MG PO TABS: 1000.0000 mg | ORAL_TABLET | Freq: Two times a day (BID) | ORAL | Status: DC

## 2011-11-07 MED ORDER — GLIPIZIDE 10 MG PO TABS: 10.0000 mg | ORAL_TABLET | Freq: Two times a day (BID) | ORAL | Status: DC

## 2011-11-07 NOTE — Progress Notes (Signed)
Patient ID: Rodney Mcgee, male   DOB: Sep 10, 1960, 51 y.o.   MRN: [id number] Subjective:     HPI: Patient is a 51 year old male here for routine followup. He was last seen over a year ago in April 2012.  Patient reports increased stress recently 2/2 divorce.  He denies suicidal/homicidial ideation.  DM:  Patient reports he has been taking his medications as directed but ran our of meds ~1 week ago.  He denies any adverse effect from the medications.  Recently started an intense exercise class and has been walking 4mi daily for the past 3 weeks.     HTN: pt does not recall getting a prescription for hydrochlorothiazide his last office visit.    Please refer to assessment and plan for further details regarding the patient's chronic medical problems and acute medical concerns.  Review of Systems Constitutional: Negative for fever, chills, diaphoresis, activity change, appetite change, fatigue and unexpected weight change.  HENT: Negative for hearing loss, congestion and neck stiffness.   Eyes: Negative for photophobia, pain and visual disturbance.  Respiratory: Negative for cough, chest tightness, shortness of breath and wheezing.   Cardiovascular: Negative for chest pain and palpitations.  Gastrointestinal: Negative for abdominal pain, blood in stool and anal bleeding.  Genitourinary: Negative for dysuria, hematuria and difficulty urinating.  Musculoskeletal: Negative for joint swelling.  Neurological: Negative for dizziness, syncope, speech difficulty, weakness, numbness and headaches.      Objective:   Physical Exam VItal signs reviewed. GEN: No apparent distress.  Alert and oriented x 3.  Pleasant, conversant, and cooperative to exam. HEENT: head is autraumatic and normocephalic.  Neck is supple without palpable masses or lymphadenopathy.  No JVD or carotid bruits.  Vision intact.  EOMI.  PERRLA.  Sclerae anicteric.  Conjunctivae without pallor or injection. Mucous membranes are moist.   Oropharynx is without erythema, exudates, or other abnormal lesions.   RESP:  Lungs are clear to ascultation bilaterally with good air movement.  No wheezes, ronchi, or rubs. CARDIOVASCULAR: regular rate, normal rhythm.  Clear S1, S2, no murmurs, gallops, or rubs. ABDOMEN: soft, non-tender, non-distended.  Bowels sounds present in all quadrants and normoactive.  No palpable masses. EXT: warm and dry.  Peripheral pulses equal, intact, and +2 globally.  No clubbing or cyanosis. No edema in bilateral lower extremities.  Bilateral toenail onychomycosis noted. SKIN: warm and dry with normal turgor.  No rashes or abnormal lesions observed. NEURO: CN II-XII grossly intact.  Muscle strength +5/5 in bilateral upper and lower extremities.  Sensation is grossly intact.  No focal deficit.     Assessment/Plan:

## 2011-11-07 NOTE — Assessment & Plan Note (Signed)
Blood pressure slightly elevated above goal today and has been for over one year upon review of EMR.  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 We'll therefore start HCTZ 12.5 mg tabs daily.  Will recheck BP and BMET at follow up OV in 1 month.

## 2011-11-07 NOTE — Assessment & Plan Note (Signed)
Patient states she recently started smoking again as a result of increased stress. Currently smoking approximately 2 cigarettes per day. Encouraged patient to quit. He was provided with information for the quit line with the hopes that he will be able to receive free nicotine replacement therapy. We'll provide him with educational handouts regarding benefits of smoking cessation and tips for success. Will followup on this at his next office visit.

## 2011-11-07 NOTE — Assessment & Plan Note (Addendum)
Hemoglobin A1c today is improved from previous but still above goal at 9.2 .   Will check urine microalbumin creatinine ratio, metabolic panel to assess renal function, obtain lipid panel, and perform diabetic foot exam today.   Pneumovax offered to patient and he is agreeable to vaccination; will give today.  He is currently uninsured and unfortunately does not meet criteria for [employer]; income is too high.   Discussed possibility of starting insulin with need for close followup and regular CBG testing.  Patient wishes to proceed with trial of healthy lifestyle habits including increased exercise and healthy dietary choices for the next 3 months. I believe this is reasonable.  Patient is under significant stress at the moment and would be very difficult for him to return for frequent visits over the next few weeks to titrate insulin.  He is agreeable to initiating insulin therapy at his followup office visit his hemoglobin A1c is still significantly above goal.  Encourage patient to take rx to [facility] as both metformin and glipizide are available for free.  He is due for an annual diabetic eye exam.  Unfortunately, there are currently no slots available for routine screening exam for uninsured patients.   Will plan to place him on listed his followup visit in 3 months as open slots are expected at that time.

## 2011-11-07 NOTE — Patient Instructions (Addendum)
Schedule a follow up appointment with your primary care doctor in 3 months.  THIS IS IMPORTANT! Your her prescriptions were sent to your [facility] pharmacy. Both of your diabetes drugs are available for free at [facility].  There is a yearly enrollment free of $4.95 fee to obtain free prescriptions. I will call you with the results of your lab work. Be sure to go back to the health dept. For a repeat test. HCTZ (hydrochlorothiazide) a new medicine for your blood pressure.  Take one pill daily as directed. If you notice any side effects, call the clinic at [phone]. Keep taking your medications as directed.  You Can Quit Smoking If you are ready to quit smoking or are thinking about it, congratulations! You have chosen to help yourself be healthier and live longer! There are lots of different ways to quit smoking. Nicotine gum, nicotine patches, a nicotine inhaler, or nicotine nasal spray can help with physical craving. Hypnosis, support groups, and medicines help break the habit of smoking. TIPS TO GET OFF AND STAY OFF CIGARETTES  Learn to predict your moods. Do not let a bad situation be your excuse to have a cigarette. Some situations in your life might tempt you to have a cigarette.   Ask friends and co-workers not to smoke around you.   Make your home smoke-free.   Never have "just one" cigarette. It leads to wanting another and another. Remind yourself of your decision to quit.   On a card, make a list of your reasons for not smoking. Read it at least the same number of times a day as you have a cigarette. Tell yourself everyday, "I do not want to smoke. I choose not to smoke."   Ask someone at home or work to help you with your plan to quit smoking.   Have something planned after you eat or have a cup of coffee. Take a walk or get other exercise to perk you up. This will help to keep you from overeating.   Try a relaxation exercise to calm you down and decrease your stress.  Remember, you may be tense and nervous the first two weeks after you quit. This will pass.   Find new activities to keep your hands busy. Play with a pen, coin, or rubber band. Doodle or draw things on paper.   Brush your teeth right after eating. This will help cut down the craving for the taste of tobacco after meals. You can try mouthwash too.   Try gum, breath mints, or diet candy to keep something in your mouth.  IF YOU SMOKE AND WANT TO QUIT:  Do not stock up on cigarettes. Never buy a carton. Wait until one pack is finished before you buy another.   Never carry cigarettes with you at work or at home.   Keep cigarettes as far away from you as possible. Leave them with someone else.   Never carry matches or a lighter with you.   Ask yourself, "Do I need this cigarette or is this just a reflex?"   Bet with someone that you can quit. Put cigarette money in a piggy bank every morning. If you smoke, you give up the money. If you do not smoke, by the end of the week, you keep the money.   Keep trying. It takes 21 [address]pp to change a habit!   Talk to your doctor about using medicines to help you quit. These include nicotine replacement gum, lozenges, or skin patches.  Document Released: 04/06/2009 Document Revised: 05/30/2011 Document Reviewed: 04/06/2009 [facility] Patient Information 2012 [place], [place].Smoking Cessation, Tips for Success YOU CAN QUIT SMOKING If you are ready to quit smoking, congratulations! You have chosen to help yourself be healthier. Cigarettes bring nicotine, tar, carbon monoxide, and other irritants into your body. Your lungs, heart, and blood vessels will be able to work better without these poisons. There are many different ways to quit smoking. Nicotine gum, nicotine patches, a nicotine inhaler, or nicotine nasal spray can help with physical craving. Hypnosis, support groups, and medicines help break the habit of smoking. Here are some tips to help you quit for  good.  Throw away all cigarettes.   Clean and remove all ashtrays from your home, work, and car.   On a card, write down your reasons for quitting. Carry the card with you and read it when you get the urge to smoke.   Cleanse your body of nicotine. Drink enough water and fluids to keep your urine clear or pale yellow. Do this after quitting to flush the nicotine from your body.   Learn to predict your moods. Do not let a bad situation be your excuse to have a cigarette. Some situations in your life might tempt you into wanting a cigarette.   Never have "just one" cigarette. It leads to wanting another and another. Remind yourself of your decision to quit.   Change habits associated with smoking. If you smoked while driving or when feeling stressed, try other activities to replace smoking. Stand up when drinking your coffee. Brush your teeth after eating. Sit in a different chair when you read the paper. Avoid alcohol while trying to quit, and try to drink fewer caffeinated beverages. Alcohol and caffeine may urge you to smoke.   Avoid foods and drinks that can trigger a desire to smoke, such as sugary or spicy foods and alcohol.   Ask people who smoke not to smoke around you.   Have something planned to do right after eating or having a cup of coffee. Take a walk or exercise to perk you up. This will help to keep you from overeating.   Try a relaxation exercise to calm you down and decrease your stress. Remember, you may be tense and nervous for the first 2 weeks after you quit, but this will pass.   Find new activities to keep your hands busy. Play with a pen, coin, or rubber band. Doodle or draw things on paper.   Brush your teeth right after eating. This will help cut down on the craving for the taste of tobacco after meals. You can try mouthwash, too.   Use oral substitutes, such as lemon drops, carrots, a cinnamon stick, or chewing gum, in place of cigarettes. Keep them handy so they  are available when you have the urge to smoke.   When you have the urge to smoke, try deep breathing.   Designate your home as a nonsmoking area.   If you are a heavy smoker, ask your caregiver about a prescription for nicotine chewing gum. It can ease your withdrawal from nicotine.   Reward yourself. Set aside the cigarette money you save and buy yourself something nice.   Look for support from others. Join a support group or smoking cessation program. Ask someone at home or at work to help you with your plan to quit smoking.   Always ask yourself, "Do I need this cigarette or is this just a reflex?" Tell yourself, "  Today, I choose not to smoke," or "I do not want to smoke." You are reminding yourself of your decision to quit, even if you do smoke a cigarette.  HOW WILL I FEEL WHEN I QUIT SMOKING?  The benefits of not smoking start within [place] of quitting.   You may have symptoms of withdrawal because your body is used to nicotine (the addictive substance in cigarettes). You may crave cigarettes, be irritable, feel very hungry, cough often, get headaches, or have difficulty concentrating.   The withdrawal symptoms are only temporary. They are strongest when you first quit but will go away within 10 to 14 [place].   When withdrawal symptoms occur, stay in control. Think about your reasons for quitting. Remind yourself that these are signs that your body is healing and getting used to being without cigarettes.   Remember that withdrawal symptoms are easier to treat than the major diseases that smoking can cause.   Even after the withdrawal is over, expect periodic urges to smoke. However, these cravings are generally short-lived and will go away whether you smoke or not. Do not smoke!   If you relapse and smoke again, do not lose hope. Most smokers quit 3 times before they are successful.   If you relapse, do not give up! Plan ahead and think about what you will do the next time you get the  urge to smoke.  LIFE AS A NONSMOKER: MAKE IT FOR A MONTH, MAKE IT FOR LIFE Day 1: Hang this page where you will see it every day. Day 2: Get rid of all ashtrays, matches, and lighters. Day 3: Drink water. Breathe deeply between sips. Day 4: Avoid places with smoke-filled air, such as bars, clubs, or the smoking section of restaurants. Day 5: Keep track of how much money you save by not smoking. Day 6: Avoid boredom. Keep a good book with you or go to the movies. Day 7: Reward yourself! One week without smoking! Day 8: Make a dental appointment to get your teeth cleaned. Day 9: Decide how you will turn down a cigarette before it is offered to you. Day 10: Review your reasons for quitting. Day 11: Distract yourself. Stay active to keep your mind off smoking and to relieve tension. Take a walk, exercise, read a book, do a crossword puzzle, or try a new hobby. Day 12: Exercise. Get off the bus before your stop or use stairs instead of escalators. Day 13: Call on friends for support and encouragement. Day 14: Reward yourself! Two weeks without smoking! Day 15: Practice deep breathing exercises. Day 16: Bet a friend that you can stay a nonsmoker. Day 17: Ask to sit in nonsmoking sections of restaurants. Day 18: Hang up "No Smoking" signs. Day 19: Think of yourself as a nonsmoker. Day 20: Each morning, tell yourself you will not smoke. Day 21: Reward yourself! Three weeks without smoking! Day 22: Think of smoking in negative ways. Remember how it stains your teeth, gives you bad breath, and leaves you short of breath. Day 23: Eat a nutritious breakfast. Day 24:Do not relive your [place] as a smoker. Day 25: Hold a pencil in your hand when talking on the telephone. Day 26: Tell all your friends you do not smoke. Day 27: Think about how much better food tastes. Day 28: Remember, one cigarette is one too many. Day 29: Take up a hobby that will keep your hands busy. Day 30: Congratulations! One  month without smoking! Give yourself a  big reward. Your caregiver can direct you to community resources or hospitals for support, which may include:  Group support.   Education.   Hypnosis.   Subliminal therapy.  Document Released: 03/08/2004 Document Revised: 05/30/2011 Document Reviewed: 03/27/2009 [facility] Patient Information 2012 [place], [place].

## 2011-11-07 NOTE — Assessment & Plan Note (Signed)
Patient reports his wife cheated on him and he is concerned about possible STDs.  States he was tested for HIV at the health dept 15mo ago with results negative for HIV.  Encouraged patient to return to health dept for free repeat HIV testing to ensure he is not infected given possibility of delay in seroconversion.  Patient denies any abnormal penile lesions or penile discharge.  He agrees to testing for gonorrhea and chlamydia; will send urine for PCR today.

## 2011-11-07 NOTE — Assessment & Plan Note (Signed)
Patient currently not taking statin. We'll repeat lipid profile today and check liver function tests. If indicated, we'll resume statin therapy.

## 2011-11-07 NOTE — Assessment & Plan Note (Signed)
As discussed below there are no open slots for uninsured patients to be referred for annual diabetic eye exam. Will attempt to place him a list of his next office visit in 3 months.

## 2011-11-08 ENCOUNTER — Telehealth: Payer: Self-pay | Admitting: Internal Medicine

## 2011-11-08 ENCOUNTER — Other Ambulatory Visit: Payer: Self-pay | Admitting: Dietician

## 2011-11-08 DIAGNOSIS — E119 Type 2 diabetes mellitus without complications: Secondary | ICD-10-CM

## 2011-11-08 NOTE — Telephone Encounter (Signed)
Called patient to inform him of negative STD testing. Patient not available and phone with the voicemail. Left message informing patient that "lab results were okay;" established that this would be an acceptable message to me for the patient at his office visit. Also left a message stating that patient should contact the clinic if he has any concerns or questions.

## 2012-03-13 ENCOUNTER — Ambulatory Visit (INDEPENDENT_AMBULATORY_CARE_PROVIDER_SITE_OTHER): Payer: Self-pay | Admitting: Internal Medicine

## 2012-03-13 ENCOUNTER — Encounter: Payer: Self-pay | Admitting: Internal Medicine

## 2012-03-13 VITALS — BP 140/89 | HR 86 | Temp 97.0°F | Ht 67.0 in | Wt 184.7 lb

## 2012-03-13 DIAGNOSIS — E785 Hyperlipidemia, unspecified: Secondary | ICD-10-CM

## 2012-03-13 DIAGNOSIS — Z79899 Other long term (current) drug therapy: Secondary | ICD-10-CM

## 2012-03-13 DIAGNOSIS — F17200 Nicotine dependence, unspecified, uncomplicated: Secondary | ICD-10-CM

## 2012-03-13 DIAGNOSIS — E11329 Type 2 diabetes mellitus with mild nonproliferative diabetic retinopathy without macular edema: Secondary | ICD-10-CM

## 2012-03-13 DIAGNOSIS — E1139 Type 2 diabetes mellitus with other diabetic ophthalmic complication: Secondary | ICD-10-CM

## 2012-03-13 DIAGNOSIS — K0381 Cracked tooth: Secondary | ICD-10-CM

## 2012-03-13 DIAGNOSIS — I10 Essential (primary) hypertension: Secondary | ICD-10-CM

## 2012-03-13 DIAGNOSIS — E11319 Type 2 diabetes mellitus with unspecified diabetic retinopathy without macular edema: Secondary | ICD-10-CM

## 2012-03-13 DIAGNOSIS — E119 Type 2 diabetes mellitus without complications: Secondary | ICD-10-CM

## 2012-03-13 DIAGNOSIS — J309 Allergic rhinitis, unspecified: Secondary | ICD-10-CM

## 2012-03-13 DIAGNOSIS — Z0000 Encounter for general adult medical examination without abnormal findings: Secondary | ICD-10-CM | POA: Insufficient documentation

## 2012-03-13 DIAGNOSIS — Z23 Encounter for immunization: Secondary | ICD-10-CM

## 2012-03-13 DIAGNOSIS — J302 Other seasonal allergic rhinitis: Secondary | ICD-10-CM

## 2012-03-13 MED ORDER — GUAIFENESIN ER 600 MG PO TB12: 600.0000 mg | ORAL_TABLET | Freq: Two times a day (BID) | ORAL | Status: AC

## 2012-03-13 MED ORDER — HYDROCHLOROTHIAZIDE 25 MG PO TABS: 25.0000 mg | ORAL_TABLET | Freq: Every day | ORAL | Status: DC

## 2012-03-13 MED ORDER — GLIPIZIDE 10 MG PO TABS: 10.0000 mg | ORAL_TABLET | Freq: Two times a day (BID) | ORAL | Status: DC

## 2012-03-13 MED ORDER — METFORMIN HCL 1000 MG PO TABS: 1000.0000 mg | ORAL_TABLET | Freq: Two times a day (BID) | ORAL | Status: DC

## 2012-03-13 NOTE — Patient Instructions (Addendum)
Please take both Metformin 1000mg  and Glipizide 10mg  two times a day. Please continue to be mindful of what you are eating., and try to avoid/reduce your carbohydrate intake and decrease your fatty food consumption (including the fried foods). Check your blood sugars before and after breakfast, lunch, and dinner, and before bedtime. Please record your blood sugars and bring the log with you to your next appointment in 3 months.  Please take the Hydrochlorothiazide (HCTZ) 25mg  daily.  Check you stool for blood using the cards given to you in the clinic, and return them to the clinic to be evaluated. Once you are able to afford the colonoscopy, we will send you for one.  You should be able to have an eye exam at [facility]- give them a call before going there. Also consider contacting [facility]; they might be able to provide you with an eye exam given you diabetic retinopathy. You can go to the traveling free dental clinic to have dental services.  Follow up with me in the clinic in December.   Diabetes Meal Planning Guide The diabetes meal planning guide is a tool to help you plan your meals and snacks. It is important for people with diabetes to manage their blood glucose (sugar) levels. Choosing the right foods and the right amounts throughout your day will help control your blood glucose. Eating right can even help you improve your blood pressure and reach or maintain a healthy weight. CARBOHYDRATE COUNTING MADE EASY When you eat carbohydrates, they turn to sugar. This raises your blood glucose level. Counting carbohydrates can help you control this level so you feel better. When you plan your meals by counting carbohydrates, you can have more flexibility in what you eat and balance your medicine with your food intake. Carbohydrate counting simply means adding up the total amount of carbohydrate grams in your meals and snacks. Try to eat about the same amount at each  meal. Foods with carbohydrates are listed below. Each portion below is 1 carbohydrate serving or 15 grams of carbohydrates. Ask your dietician how many grams of carbohydrates you should eat at each meal or snack. Grains and Starches  1 slice bread.    English muffin or hotdog/hamburger bun.    cup cold cereal (unsweetened).   ? cup cooked pasta or rice.    cup starchy vegetables (corn, potatoes, peas, beans, winter squash).   1 tortilla (6 inches).    bagel.   1 waffle or pancake (size of a CD).    cup cooked cereal.   4 to 6 small crackers.  *Whole grain is recommended. Fruit  1 cup fresh unsweetened berries, melon, papaya, pineapple.   1 small fresh fruit.    banana or mango.    cup fruit juice (4 oz unsweetened).    cup canned fruit in natural juice or water.   2 tbs dried fruit.   12 to 15 grapes or cherries.  Milk and Yogurt  1 cup fat-free or 1% milk.   1 cup soy milk.   6 oz light yogurt with sugar-free sweetener.   6 oz low-fat soy yogurt.   6 oz plain yogurt.  Vegetables  1 cup raw or  cup cooked is counted as 0 carbohydrates or a "free" food.   If you eat 3 or more servings at 1 meal, count them as 1 carbohydrate serving.  Other Carbohydrates   oz chips or pretzels.    cup ice cream or frozen yogurt.  cup sherbet or sorbet.   2 inch square cake, no frosting.   1 tbs honey, sugar, jam, jelly, or syrup.   2 small cookies.   3 squares of graham crackers.   3 cups popcorn.   6 crackers.   1 cup broth-based soup.   Count 1 cup casserole or other mixed foods as 2 carbohydrate servings.   Foods with less than 20 calories in a serving may be counted as 0 carbohydrates or a "free" food.  You may want to purchase a book or computer software that lists the carbohydrate gram counts of different foods. In addition, the nutrition facts panel on the labels of the foods you eat are a good source of this information. The label will  tell you how big the serving size is and the total number of carbohydrate grams you will be eating per serving. Divide this number by 15 to obtain the number of carbohydrate servings in a portion. Remember, 1 carbohydrate serving equals 15 grams of carbohydrate. SERVING SIZES Measuring foods and serving sizes helps you make sure you are getting the right amount of food. The list below tells how big or small some common serving sizes are.  1 oz.........4 stacked dice.   3 oz........Marland KitchenDeck of cards.   1 tsp.......Marland KitchenTip of little finger.   1 tbs......Marland KitchenMarland KitchenThumb.   2 tbs.......Marland KitchenGolf ball.    cup......Marland KitchenHalf of a fist.   1 cup.......Marland KitchenA fist.  SAMPLE DIABETES MEAL PLAN Below is a sample meal plan that includes foods from the grain and starches, dairy, vegetable, fruit, and meat groups. A dietician can individualize a meal plan to fit your calorie needs and tell you the number of servings needed from each food group. However, controlling the total amount of carbohydrates in your meal or snack is more important than making sure you include all of the food groups at every meal. You may interchange carbohydrate containing foods (dairy, starches, and fruits). The meal plan below is an example of a 2000 calorie diet using carbohydrate counting. This meal plan has 17 carbohydrate servings. Breakfast  1 cup oatmeal (2 carb servings).    cup light yogurt (1 carb serving).   1 cup blueberries (1 carb serving).    cup almonds.  Snack  1 large apple (2 carb servings).   1 low-fat string cheese stick.  Lunch  Chicken breast salad.   1 cup spinach.    cup chopped tomatoes.   2 oz chicken breast, sliced.   2 tbs low-fat [place] dressing.   12 whole-wheat crackers (2 carb servings).   12 to 15 grapes (1 carb serving).   1 cup low-fat milk (1 carb serving).  Snack  1 cup carrots.    cup hummus (1 carb serving).  Dinner  3 oz broiled salmon.   1 cup brown rice (3 carb servings).    Snack  1  cups steamed broccoli (1 carb serving) drizzled with 1 tsp olive oil and lemon juice.   1 cup light pudding (2 carb servings).  DIABETES MEAL PLANNING WORKSHEET Your dietician can use this worksheet to help you decide how many servings of foods and what types of foods are right for you.   BREAKFAST Food Group and Servings / Carb Servings Grain/Starches __________________________________ Dairy __________________________________________ Vegetable ______________________________________ Fruit ___________________________________________ Meat __________________________________________ Fat ____________________________________________ LUNCH Food Group and Servings / Carb Servings Grain/Starches ___________________________________ Dairy ___________________________________________ Fruit ____________________________________________ Meat ___________________________________________ Fat _____________________________________________ Laural Golden Food Group and Servings / Carb Servings Grain/Starches ___________________________________ Dairy ___________________________________________ Zada Girt  ____________________________________________ Meat ___________________________________________ Fat _____________________________________________ SNACKS Food Group and Servings / Carb Servings Grain/Starches ___________________________________ Dairy ___________________________________________ Vegetable _______________________________________ Fruit ____________________________________________ Meat ___________________________________________ Fat _____________________________________________ DAILY TOTALS Starches _________________________ Vegetable ________________________ Fruit ____________________________ Dairy ____________________________ Meat ____________________________ Fat ______________________________ Document Released: 03/07/2005 Document Revised: 05/30/2011 Document Reviewed:  01/16/2009 [facility] Patient Information 2012 [place], [place].

## 2012-03-13 NOTE — Assessment & Plan Note (Signed)
While he denies seasonal allergies, he does endorse chronic nasal congestion. Starting Mucinex 600mg  BID PRN congestion.

## 2012-03-13 NOTE — Assessment & Plan Note (Signed)
He needs an Opthalmological exam, but is uninsured, makes too much to qualify for an [employer], and cannot afford to see a specialist at this time. He was given information on the eye exams offered by [facility], and info on other eye services in [place]. He was also told to call [facility] to see if they could assist.

## 2012-03-13 NOTE — Progress Notes (Signed)
Patient ID: Rodney Mcgee, male   DOB: 09-21-1960, 51 y.o.   MRN: [id number]  Subjective:   Patient ID: Rodney Mcgee male   DOB: 1961-01-28 51 y.o.   MRN: [id number]  HPI: Rodney Mcgee is a 51 y.o. male with a past history of hypertension, hyperlipidemia, diabetes, diabetic retinopathy who presents to clinic to establish himself as a patient of mine.  Rodney is supposed to be taking HCTZ for his HTN, but has poor compliance with the medication. Rodney feels that his BP is not well controlled, but endorses significant stressors in his life. Rodney states that Rodney's been under a lot of stress at the past few months. Rodney Mcgee. Rodney's currently having to pay a large amount for Mcgee support, and Rodney is unable to pay for Mcgee support and purchase insurance.  Rodney is currently taking glipizide and metformin for his diabetes, but states that time Rodney is only taking glipizide 5 mg twice a day instead of 10 mg twice a day, and is taking metformin 500 mg twice a day instead of 1000 mg twice day. Rodney states that his diet is "so-so". Endorses drinking plenty of water avoids sodas and juices, is trying to limit his carbohydrate intake, but Rodney does enjoy eating fried foods such as fried fish at least twice a week. Rodney states that Rodney is walking 3 miles a day. She does have a glucometer but states that Rodney has not been checking his blood sugars because Rodney just recently purchased the testing strips. Rodney has not had an eye exam in a couple of years and is due for one.  His history of hyperlipidemia. Lipid panel was checked at this last clinic visit 5/13. Total cholesterol was 199 with an LDL of 144 and an HDL of 33. Rodney is currently not on medication for his cholesterol.  Rodney also endorses constant nasal congestion. Rodney denies seasonal allergies. Rodney does not take medication for his congestion.  Rodney states that Rodney also has a 3 teeth that are  chipped and broken, and  Rodney needs to see a dentist. Unfortunately Rodney does not have insurance and cannot afford to see a dentist at this time.   No past medical history on file. Current Outpatient Prescriptions  Medication Sig Dispense Refill  . glipiZIDE (GLUCOTROL) 10 MG tablet Take 1 tablet (10 mg total) by mouth 2 (two) times daily.  60 tablet  6  . metFORMIN (GLUCOPHAGE) 1000 MG tablet Take 1 tablet (1,000 mg total) by mouth 2 (two) times daily with a meal.  60 tablet  6  . DISCONTD: glipiZIDE (GLUCOTROL) 10 MG tablet Take 1 tablet (10 mg total) by mouth 2 (two) times daily.  60 tablet  6  . DISCONTD: metFORMIN (GLUCOPHAGE) 1000 MG tablet Take 1 tablet (1,000 mg total) by mouth 2 (two) times daily with a meal.  60 tablet  6  . guaiFENesin (MUCINEX) 600 MG 12 hr tablet Take 1 tablet (600 mg total) by mouth 2 (two) times daily.  60 tablet  2  . hydrochlorothiazide (HYDRODIURIL) 25 MG tablet Take 1 tablet (25 mg total) by mouth daily.  30 tablet  6   Family History  Problem Relation Age of Onset  . Alcohol abuse Father    History   Social History  . Marital Status: Single    Spouse Name: N/A    Number of Children:  N/A  . Years of Education: N/A   Social History Main Topics  . Smoking status: Current Some Day Smoker -- 0.2 packs/day    Types: Cigarettes    Last Attempt to Quit: 10/01/2007  . Smokeless tobacco: None   Comment: QUIT line info given  . Alcohol Use: No  . Drug Use: No  . Sexually Active: Yes -- Male partner(s)   Other Topics Concern  . None   Social History Narrative  . None   Review of Systems: Constitutional: Denies fever, chills, diaphoresis, appetite change or fatigue.  HEENT: Endorses constant nasal congestion. Denies photophobia, eye pain, redness, hearing loss, ear pain, trouble swallowing, neck pain, neck stiffness or tinnitus.   Respiratory: Denies SOB, DOE, cough, chest tightness,  and wheezing.   Cardiovascular: Denies chest pain, palpitations or  leg swelling.  Gastrointestinal: Denies nausea, vomiting, abdominal pain, diarrhea, constipation, blood in stool or abdominal distention.  Genitourinary: Endorses frequent urination. Denies dysuria, urgency,hematuria, flank pain or difficulty urinating.  Musculoskeletal: Denies myalgias, back pain, joint swelling, arthralgias and gait problem.  Skin: Denies pallor, rash and wound.  Neurological: Denies dizziness, seizures, syncope, weakness, light-headedness, numbness and headaches.  Psychiatric/Behavioral: Denies suicidal ideation, mood changes, confusion, nervousness, sleep disturbance and agitation  Objective:  Physical Exam: Filed Vitals:   03/13/12 1011  BP: 140/89  Pulse: 86  Temp: 97 F (36.1 C)  TempSrc: Oral  Height: 5\' 7"  (1.702 m)  Weight: 184 lb 11.2 oz (83.779 kg)  SpO2: 99%   Constitutional: Vital signs reviewed.  Patient is a well-developed and well-nourished male in no acute distress and cooperative with exam. Alert and oriented x3.  Head: Normocephalic and atraumatic Mouth: Poor dentition w/ broken teeth, moist mucus membranes. Eyes: PERRL, EOMI, no scleral icterus.  Neck: Supple, Trachea midline normal ROM, No JVD, mass, or thyromegaly.  Cardiovascular: RRR, S1 normal, S2 normal, no MRG, pulses symmetric and intact bilaterally Pulmonary/Chest: CTAB, no wheezes, rales, or rhonchi Abdominal: Soft. Non-tender, non-distended, no masses, organomegaly, or guarding present.  GU: No CVA tenderness Musculoskeletal: No joint deformities, erythema, or stiffness, ROM full and no nontender Hematology: No cervical adenopathy.  Neurological: A&O x3, Strength is normal and symmetric bilaterally, cranial nerve II-XII are grossly intact, no focal motor deficit, sensory intact to light touch bilaterally.  Skin: Warm, dry and intact. No rash, cyanosis, or clubbing.  Psychiatric: Normal mood and affect. speech and behavior is normal. Judgment and thought content normal. Cognition and  memory are normal.   Assessment & Plan:   Please refer to problem list based assessment and plan.

## 2012-03-13 NOTE — Assessment & Plan Note (Addendum)
LDL is 144 from 5/13 labs. Pt not interested in starting Statin therapy at this time. Tot cholest 199, HDL 33.

## 2012-03-13 NOTE — Assessment & Plan Note (Signed)
He is currently smoking 1/3ppd since around May when his marriage began to dissolve. He is not interested in quitting at this time, b/c he feels that it helps him decompress after work.

## 2012-03-13 NOTE — Assessment & Plan Note (Signed)
He is uninsured and cannot afford to see a dentist at this time.  He was given information about the free traveling dental clinic and the fee adjusted dental clinic in [place].

## 2012-03-13 NOTE — Assessment & Plan Note (Signed)
He is not taking his HCTZ. He is to restart the HCTZ 25mg  daily. Attempted to place him on Lisinopril, but in looking at his medical records, he had possible angioedema associated with Lisinopril. For now, will see how he does on the HCTZ daily, and will see him in 3 mo. Will check labs in 2 weeks to make sure his potassium is ok while taking the HCTZ daily.

## 2012-03-13 NOTE — Assessment & Plan Note (Signed)
Not taking Glipizide or Metformin as prescribed. Restarting Glipizide 10mg  BID and Metformin 1000mg  BID. Pt to check CBG b/f and after meal and at bedtime for 1 week, and then TID, and keep a log to bring to his next appt. He is not interested in meeting w/ Lupita Leash Plyler at this time. Pt to f/u in 54mo.

## 2012-03-19 NOTE — Progress Notes (Signed)
I saw, examined, and discussed the patient with Dr Glenn and agree with the note contained here.   

## 2012-03-26 ENCOUNTER — Other Ambulatory Visit: Payer: Self-pay

## 2012-04-03 ENCOUNTER — Other Ambulatory Visit (INDEPENDENT_AMBULATORY_CARE_PROVIDER_SITE_OTHER): Payer: Self-pay

## 2012-04-03 DIAGNOSIS — E119 Type 2 diabetes mellitus without complications: Secondary | ICD-10-CM

## 2012-04-04 LAB — BASIC METABOLIC PANEL WITH GFR
BUN: 16 mg/dL (ref 6–23)
CO2: 24 mEq/L (ref 19–32)
Calcium: 9.7 mg/dL (ref 8.4–10.5)
Chloride: 101 mEq/L (ref 96–112)
GFR, Est African American: >89 mL/min
GFR, Est Non African American: >89 mL/min
Potassium: 4.4 mEq/L (ref 3.5–5.3)
Sodium: 137 mEq/L (ref 135–145)

## 2012-04-15 ENCOUNTER — Other Ambulatory Visit (INDEPENDENT_AMBULATORY_CARE_PROVIDER_SITE_OTHER): Payer: Self-pay

## 2012-04-15 DIAGNOSIS — Z1211 Encounter for screening for malignant neoplasm of colon: Secondary | ICD-10-CM

## 2012-04-15 DIAGNOSIS — Z0000 Encounter for general adult medical examination without abnormal findings: Secondary | ICD-10-CM

## 2012-04-15 LAB — POC HEMOCCULT BLD/STL (HOME/3-CARD/SCREEN)
Card #2 Fecal Occult Blod, POC: NEGATIVE
Fecal Occult Blood, POC: NEGATIVE

## 2012-06-12 ENCOUNTER — Encounter: Payer: Self-pay | Admitting: Dietician

## 2012-08-12 ENCOUNTER — Other Ambulatory Visit: Payer: Self-pay | Admitting: Internal Medicine

## 2012-08-12 DIAGNOSIS — I10 Essential (primary) hypertension: Secondary | ICD-10-CM

## 2012-08-12 MED ORDER — HYDROCHLOROTHIAZIDE 25 MG PO TABS: 25.0000 mg | ORAL_TABLET | Freq: Every day | ORAL | Status: DC

## 2012-08-12 NOTE — Telephone Encounter (Signed)
Rodney Mcgee was seen at the end of September, 2013 and was supposed to have labs drawn in 2 weeks and a follow up appointment in 3 months, which have not been done. He Needs a follow up appointment. This can be with any provider in the clinic. Would you mind asking the front desk to schedule?  I have refilled the rx requests with zero refills.   Thanks,  Samara Deist

## 2012-08-12 NOTE — Telephone Encounter (Signed)
Flaf sent to front desk pool for appt per Dr Sherrine Maples.

## 2012-08-13 ENCOUNTER — Other Ambulatory Visit (INDEPENDENT_AMBULATORY_CARE_PROVIDER_SITE_OTHER): Payer: Self-pay

## 2012-08-13 DIAGNOSIS — I10 Essential (primary) hypertension: Secondary | ICD-10-CM

## 2012-08-14 LAB — BASIC METABOLIC PANEL WITH GFR
BUN: 12 mg/dL (ref 6–23)
CO2: 26 mEq/L (ref 19–32)
Calcium: 9.6 mg/dL (ref 8.4–10.5)
Chloride: 101 mEq/L (ref 96–112)
Creat: 0.73 mg/dL (ref 0.50–1.35)
Glucose, Bld: 302 mg/dL — ABNORMAL HIGH (ref 70–99)

## 2012-08-21 ENCOUNTER — Other Ambulatory Visit: Payer: Self-pay | Admitting: Internal Medicine

## 2012-08-21 ENCOUNTER — Encounter: Payer: Self-pay | Admitting: Internal Medicine

## 2012-08-21 ENCOUNTER — Ambulatory Visit (INDEPENDENT_AMBULATORY_CARE_PROVIDER_SITE_OTHER): Payer: Self-pay | Admitting: Internal Medicine

## 2012-08-21 VITALS — BP 143/84 | HR 105 | Temp 97.2°F | Ht 66.5 in | Wt 184.4 lb

## 2012-08-21 DIAGNOSIS — Z0000 Encounter for general adult medical examination without abnormal findings: Secondary | ICD-10-CM

## 2012-08-21 DIAGNOSIS — F17200 Nicotine dependence, unspecified, uncomplicated: Secondary | ICD-10-CM

## 2012-08-21 DIAGNOSIS — E119 Type 2 diabetes mellitus without complications: Secondary | ICD-10-CM

## 2012-08-21 DIAGNOSIS — R0981 Nasal congestion: Secondary | ICD-10-CM

## 2012-08-21 DIAGNOSIS — I10 Essential (primary) hypertension: Secondary | ICD-10-CM

## 2012-08-21 DIAGNOSIS — Z79899 Other long term (current) drug therapy: Secondary | ICD-10-CM

## 2012-08-21 DIAGNOSIS — E785 Hyperlipidemia, unspecified: Secondary | ICD-10-CM

## 2012-08-21 LAB — GLUCOSE, CAPILLARY: Glucose-Capillary: 266 mg/dL — ABNORMAL HIGH (ref 70–99)

## 2012-08-21 NOTE — Progress Notes (Signed)
  Subjective:    Patient ID: Rodney Mcgee, male    DOB: 1961-06-07, 52 y.o.   MRN: [id number]  HPI Rodney Mcgee is a 52 year old man with PMH of DM2, HTN, seasonal allergic rhinitis, and current tobacco use who comes in for follow up of his diabetes and hypertension. He states that he is walking every day for almost two hours and is eating healthier. He has run out of his medicines for almost one week now. He has been out of his CBG meter for a while now and has not checked his CBG recently. He is still uninsured but will talk to Rodney Mcgee again in regards to obtaining the [employer] as he did not mention that he pays child support in his last application which may affect his eligibility requirements. Unfortunately he continues to smoke, sometimes only a few cigarettes per day to "decompress" from ongoing stress.  He requests refills for his medications.    Review of Systems  Constitutional: Negative for fever, chills, diaphoresis, activity change, appetite change, fatigue and unexpected weight change.  HENT: Positive for congestion. Negative for rhinorrhea.   Respiratory: Negative for cough, chest tightness and shortness of breath.   Cardiovascular: Negative for chest pain, palpitations and leg swelling.  Gastrointestinal: Negative for abdominal pain and abdominal distention.  Genitourinary: Negative for dysuria and frequency.  Musculoskeletal: Negative for myalgias and back pain.  Skin: Negative for color change, pallor, rash and wound.  Neurological: Negative for dizziness, tremors, weakness, light-headedness and headaches.  Hematological: Negative for adenopathy.  Psychiatric/Behavioral: Negative for behavioral problems and agitation.       Objective:   Physical Exam  Nursing note and vitals reviewed. Constitutional: He is oriented to person, place, and time. He appears well-developed and well-nourished. No distress.  HENT:  Mouth/Throat: Oropharynx is clear and moist. No oropharyngeal  exudate.  Mild edema of nasal turbinates bilaterally  Eyes: Conjunctivae are normal. Right eye exhibits no discharge. Left eye exhibits no discharge. No scleral icterus.  Neck: No JVD present.  Cardiovascular: Normal rate and regular rhythm.   Pulmonary/Chest: Effort normal and breath sounds normal. No respiratory distress. He has no wheezes. He has no rales. He exhibits no tenderness.  Musculoskeletal: He exhibits no edema and no tenderness.  Neurological: He is alert and oriented to person, place, and time.  Skin: Skin is warm and dry. No rash noted. He is not diaphoretic. No erythema. No pallor.  Psychiatric: He has a normal mood and affect. His behavior is normal.          Assessment & Plan:

## 2012-08-22 MED ORDER — SALINE NASAL SPRAY 0.65 % NA SOLN: 1.0000 | NASAL | Status: DC | PRN

## 2012-08-22 MED ORDER — SIMVASTATIN 20 MG PO TABS: 20.0000 mg | ORAL_TABLET | Freq: Every evening | ORAL | Status: DC

## 2012-08-22 MED ORDER — ASPIRIN EC 81 MG PO TBEC: 81.0000 mg | DELAYED_RELEASE_TABLET | Freq: Every day | ORAL | Status: DC

## 2012-08-22 NOTE — Assessment & Plan Note (Addendum)
Lab Results  Component Value Date   HGBA1C 9.4 08/21/2012   HGBA1C 9.0 06/14/2009   CREATININE 0.73 08/13/2012   CREATININE 0.72 11/11/2007   MICROALBUR 12.79* 11/07/2011   MICRALBCREAT 89.8* 11/07/2011   CHOL 199 11/07/2011   HDL 33* 11/07/2011   TRIG 111 11/07/2011    Last eye exam and foot exam: No results found for this basename: HMDIABEYEEXA, HMDIABFOOTEX    Assessment: Diabetes control: not controlled Progress toward goals: deteriorated Barriers to meeting goals: lack of insurance and nonadherence to medications He is on maximum dose for glipizide and metformin. He has changed his diet, is exercising daily. Plan: Diabetes treatment: continue current medications, although he likely will need initiation of insulin therapy he will need better financial resources, perhaps the [employer] Card before this change in therapy. He agrees and understands that he may need insulin soon. He states that his daughter is has DM1 and uses insulin.  Refer to: financial counselor Instruction/counseling given: reminded to get eye exam, reminded to bring blood glucose meter & log to each visit, reminded to bring medications to each visit, discussed foot care, discussed the need for weight loss and discussed diet. He will call us with the model number for his CBG meter to we can refill them and consider starting insulin therapy.    Microalbumin/Cr increase. He has hx of angioedema with ACEi, ARB are, therefore, also contraindicated. He needs tight control of his HTN and DM2

## 2012-08-22 NOTE — Assessment & Plan Note (Signed)
BP mildly elevated today but in the context of no HCTZ in [place].  Refilled HCTZ. (No BMET today as he remains uninsured)

## 2012-08-22 NOTE — Assessment & Plan Note (Signed)
He was given brochure on smoking cessation. He is contemplating quitting smoking.

## 2012-08-22 NOTE — Assessment & Plan Note (Addendum)
Goal LDL <100.  Started simvastatin 20mg  daily--$4 at [employer]

## 2012-09-16 ENCOUNTER — Other Ambulatory Visit: Payer: Self-pay | Admitting: Internal Medicine

## 2012-10-19 ENCOUNTER — Encounter: Payer: Self-pay | Admitting: Internal Medicine

## 2012-12-31 ENCOUNTER — Other Ambulatory Visit: Payer: Self-pay

## 2013-04-01 ENCOUNTER — Encounter: Payer: Self-pay | Admitting: Internal Medicine

## 2013-04-01 ENCOUNTER — Ambulatory Visit (INDEPENDENT_AMBULATORY_CARE_PROVIDER_SITE_OTHER): Payer: Self-pay | Admitting: Internal Medicine

## 2013-04-01 VITALS — BP 127/84 | HR 110 | Temp 97.5°F | Ht 66.0 in | Wt 179.3 lb

## 2013-04-01 DIAGNOSIS — E785 Hyperlipidemia, unspecified: Secondary | ICD-10-CM

## 2013-04-01 DIAGNOSIS — E119 Type 2 diabetes mellitus without complications: Secondary | ICD-10-CM

## 2013-04-01 DIAGNOSIS — Z113 Encounter for screening for infections with a predominantly sexual mode of transmission: Secondary | ICD-10-CM

## 2013-04-01 DIAGNOSIS — F17200 Nicotine dependence, unspecified, uncomplicated: Secondary | ICD-10-CM

## 2013-04-01 DIAGNOSIS — R369 Urethral discharge, unspecified: Secondary | ICD-10-CM

## 2013-04-01 DIAGNOSIS — I10 Essential (primary) hypertension: Secondary | ICD-10-CM

## 2013-04-01 DIAGNOSIS — R300 Dysuria: Secondary | ICD-10-CM

## 2013-04-01 LAB — POCT GLYCOSYLATED HEMOGLOBIN (HGB A1C): Hemoglobin A1C: 9.9

## 2013-04-01 MED ORDER — AZITHROMYCIN 500 MG PO TABS: 1000.0000 mg | ORAL_TABLET | Freq: Every day | ORAL | Status: AC

## 2013-04-01 MED ORDER — DIPHENHYDRAMINE HCL 25 MG PO CAPS
25.0000 mg | ORAL_CAPSULE | Freq: Once | ORAL | Status: AC
  Administered 2013-04-01: 25 mg via ORAL

## 2013-04-01 MED ORDER — SITAGLIPTIN PHOSPHATE 50 MG PO TABS: 50.0000 mg | ORAL_TABLET | Freq: Every day | ORAL | Status: DC

## 2013-04-01 MED ORDER — LIDOCAINE HCL 1 % IJ SOLN
250.0000 mg | Freq: Once | INTRAMUSCULAR | Status: AC
  Administered 2013-04-01: 250 mg via INTRAMUSCULAR

## 2013-04-01 NOTE — Progress Notes (Signed)
Patient ID: Rodney Mcgee, male   DOB: 08/01/1960, 52 y.o.   MRN: [id number]  Subjective:   Patient ID: Rodney Mcgee male   DOB: Feb 16, 1961 52 y.o.   MRN: [id number]  HPI: [name] is a 52 y.o. M with PMH DM2, HTN, HLD, and tobacco abuse presents to the clinic for a routine f/u.   He was last seen Feb '14. His A1c at that time was 9.4, from 9.0.  He states that he continues to exercise lately and has a boarding fatty fried foods eating date and a boiled fish and chicken. However he does state he eats white rice with most meals or carrots. He took his blood sugar twice a day, and feels that his CBGs run in the mid 200s. He states he has met with our nutritionist on a [place] in the past and still has all information from her, which he states he will review. He's currently taking glipizide 10 mg by mouth twice a day and metformin 1000 mg by mouth twice a day, with which he endorses complete compliance. He did not bring his meter today.  He endorses some peel discharge that occurred after intercourse with his ex wife last week. He said that they had unprotected sex. He noticed a few Hergert later that he had 2 Rubel of greenish penile discharge and pain with urination. That has since resolved.  He is still smoking, but states he is ready to quit.  He still has not applied for the [employer] yet, but did meet with Rudell Cobb today, and states he has quit his information together to complete his application.    No past medical history on file. Current Outpatient Prescriptions  Medication Sig Dispense Refill  . glipiZIDE (GLUCOTROL) 10 MG tablet TAKE ONE TABLET BY MOUTH TWICE DAILY  60 tablet  11  . hydrochlorothiazide (HYDRODIURIL) 25 MG tablet TAKE 1 TABLET (25 MG TOTAL) BY MOUTH DAILY.  30 tablet  11  . metFORMIN (GLUCOPHAGE) 1000 MG tablet TAKE ONE TABLET BY MOUTH TWICE DAILY WITH MEALS  60 tablet  11  . simvastatin (ZOCOR) 20 MG tablet Take 1 tablet (20 mg total) by mouth every evening.  30 tablet   11  . sodium chloride (OCEAN NASAL SPRAY) 0.65 % nasal spray Place 1 spray into the nose as needed for congestion.  30 mL  12   No current facility-administered medications for this visit.   Family History  Problem Relation Age of Onset  . Alcohol abuse Father    History   Social History  . Marital Status: Single    Spouse Name: N/A    Number of Children: N/A  . Years of Education: N/A   Social History Main Topics  . Smoking status: Current Some Day Smoker -- 0.20 packs/day    Types: Cigarettes    Last Attempt to Quit: 10/01/2007  . Smokeless tobacco: None     Comment: QUIT line info given  . Alcohol Use: No  . Drug Use: No  . Sexual Activity: Yes    Partners: Female   Other Topics Concern  . None   Social History Narrative  . None   Review of Systems: Constitutional: Denies fever, chills, diaphoresis, appetite change and fatigue.  HEENT: Denies photophobia, eye pain, redness, hearing loss, ear pain, congestion, sore throat, rhinorrhea, sneezing, mouth sores, trouble swallowing, neck pain, neck stiffness and tinnitus.   Respiratory: Denies SOB, DOE, cough, chest tightness,  and wheezing.   Cardiovascular: Denies chest pain,  palpitations and leg swelling.  Gastrointestinal: Denies nausea, vomiting, abdominal pain, diarrhea, constipation, blood in stool and abdominal distention.  Genitourinary: +greenish penile discharge and pain with urination x2 Melgoza, resolved.  Endocrine: Denies: hot or cold intolerance, sweats, changes in hair or nails, polyuria, polydipsia. Musculoskeletal: Denies myalgias, back pain, joint swelling, arthralgias and gait problem.  Skin: Denies pallor, rash and wound.  Neurological: Denies dizziness, seizures, syncope, weakness, light-headedness, numbness and headaches.  Psychiatric/Behavioral: Denies suicidal ideation, mood changes, confusion, nervousness, sleep disturbance and agitation  Objective:  Physical Exam: Filed Vitals:   04/01/13 1537   BP: 127/84  Pulse: 110  Temp: 97.5 F (36.4 C)  TempSrc: Oral  Height: 5\' 6"  (1.676 m)  Weight: 179 lb 4.8 oz (81.33 kg)  SpO2: 96%   Constitutional: Vital signs reviewed.  Patient is a well-developed and well-nourished male in no acute distress and cooperative with exam. Alert.  Head: Normocephalic and atraumatic Eyes: PERRL, EOMI, conjunctivae normal, No scleral icterus.  Neck: Supple, Trachea midline normal ROM Cardiovascular: RRR, S1 normal, S2 normal, no MRG, pulses symmetric and intact bilaterally Pulmonary/Chest: normal respiratory effort, CTAB, no wheezes, rales, or rhonchi Abdominal: Soft. Non-tender, non-distended, bowel sounds are normal, no masses, organomegaly, or guarding present.  GU: no CVA tenderness Musculoskeletal: No joint deformities, erythema, or stiffness, ROM full and no nontender Neurological: A&O x3, Strength is normal and symmetric bilaterally, cranial nerve II-XII are grossly intact, no focal motor deficit, sensory intact to light touch bilaterally.  Skin: Warm, dry and intact. No rash, cyanosis, or clubbing.  Psychiatric: Normal mood and affect. speech and behavior is normal. Judgment and thought content normal. Cognition and memory are normal.   Assessment & Plan:   Please refer to Problem List based Assessment and Plan

## 2013-04-01 NOTE — Assessment & Plan Note (Addendum)
Checking lipid panel today. Pt on Simvastatin 20mg  po daily currently

## 2013-04-01 NOTE — Assessment & Plan Note (Signed)
  Assessment: Progress toward smoking cessation:  smoking the same amount Barriers to progress toward smoking cessation:  withdrawal symptoms   Plan: Instruction/counseling given:  I counseled patient on the dangers of tobacco use, advised patient to stop smoking, and reviewed strategies to maximize success. Educational resources provided:  [employer]) brochure Self management tools provided:  smoking cessation plan (STAR Quit Plan) Medications to assist with smoking cessation:  None Patient agreed to the following self-care plans for smoking cessation: go to the [employer] ([email or web address])

## 2013-04-01 NOTE — Assessment & Plan Note (Signed)
Lab Results  Component Value Date   HGBA1C 9.9 04/01/2013   HGBA1C 9.4 08/21/2012   HGBA1C 8.8 03/13/2012     Assessment: Diabetes control: poor control (HgbA1C >9%) Progress toward A1C goal:  deteriorated Comments: Pt eating lots of white rice and starchy vegetables like carrots.  Plan: Medications:  Continue the Glipizide 10mg  BID adn metformin 1000mg  BID and will add Januvia 50mg  daily and will uptitrate if needed in 3 mo. Home glucose monitoring: Frequency: 2 times a day Timing: before meals Instruction/counseling given: reminded to bring blood glucose meter & log to each visit, reminded to bring medications to each visit and discussed diet Educational resources provided: brochure Self management tools provided: copy of home glucose meter download Other plans: F/u in 3 mo. Discussed the importance of keeping regularly scheduled appointments to keep his diabetes under better control.

## 2013-04-01 NOTE — Assessment & Plan Note (Addendum)
Patient recently had unprotected sex last week and subsequently developed penile discharge and pain with urination. He states this occurred for 2 Dufner and has since resolved. Patient has a bad track record with keeping clinic appointments, so we'll treat empirically for GC and Chlamydia - Checking UA - Checking urine GC/Chlamydia - Rocephin 250 mg IM x1, pretreated with Benadryl 25 mg by mouth, given remote possible penicillin reaction resulting in hives (patient denies any angioedema or restrictions). He was also given 25mg  po benadryl to take at home. He was instructed to go to the ED if he began to develop hives, SOB, or felt like his throat was swelling.  - Azithromycin 1 g by mouth x1  Addendum: 04/08/13: Urine tested positive for gonorrhea. Pt already treated with Rocephin while in the clinic. Pt was notified on 04/08/13, and was told that he needed to contact any recent partners so they can be treated and inform their recent partners as well for treatment.

## 2013-04-01 NOTE — Assessment & Plan Note (Addendum)
BP Readings from Last 3 Encounters:  04/01/13 127/84  08/21/12 143/84  03/13/12 140/89    Lab Results  Component Value Date   NA 137 08/13/2012   K 4.3 08/13/2012   CREATININE 0.73 08/13/2012    Assessment: Blood pressure control: controlled Progress toward BP goal:  at goal Comments: BP under good control  Plan: Medications:  continue current medications of HCTZ 25mg  po daily Educational resources provided: brochure Self management tools provided: home blood pressure logbook Other plans: F/u in 3 mo

## 2013-04-01 NOTE — Patient Instructions (Addendum)
You are being treated with Rocephin and Azithromycin for a possible STD infection. With your possible history of a penicillin reaction resulting in hives, you have been given Benadryl. If you begin to develop hives, SOB, or felt like you throat is swelling/closing, call EMS or go to the emergency room as soon as possible.   Chlamydia, Females and Males Chlamydia is an infection that can be found in the vagina, urethra, cervix, rectum and pelvic organs in the male. In the male, it most often causes urethritis. This happens when it infects the tube (urethra) that carries the urine out of the bladder. When Chlamydia causes urethritis, there may be burning with urination. In males, it may also infect the tubes that carry the sperm from the testicle. This causes pain in the testicles and infect the prostate gland. In females, an infection of the pelvic organs is also called PID (pelvic inflammatory disease). PID may be a cause of sudden (acute) lower abdominal/belly (pelvic) pain and fever. But with Chlamydia, the infection sometimes does not cause problems that you notice (asymptomatic). It may cause an abnormal or watery mucous-like discharge from the birth canal (vagina) or penis.  CAUSES  Chlamydia is caused by germs (bacteria) that are spread during sexual contact of the:  Genitals.  Mouth.  Rectum. This infection may also be passed to a newborn baby coming through the infected birth canal. This causes eye and lung infections in the baby. Chlamydia often goes unnoticed. So it is easy to transmit it to a sexual partner without even knowing. SYMPTOMS  In females, symptoms may go unnoticed. Symptoms that are more noticeable can include:  Belly (abdominal) pain.  Painful intercourse.  Watery mucous-like discharge from the vagina.  Miscarriage.  Discomfort when urinating.  Inflammation of the rectum. In males, symptoms include:  Burning with urination.  Pain in the testicles.  Watery  mucous-like discharge from the penis. It can cause longstanding (chronic) pelvic pain after frequent infections. TREATMENT   Chlamydia can be treated with medications which kill germs(antibiotics).  Inform all sexual partners about the infection. All sexual contacts need to be treated.  If you are pregnant, do not take tetracycline type antibiotics.  PID can cause women to not be able to have children (sterile) if left untreated or if half-treated. It does this by scarring the tubes to the uterus (fallopian tubes). They carry the egg needed to form a baby. It is important to finish ALL medications given to you.  Sterility or future tubal (ectopic) pregnancies can occur in fully treated individuals. It is important to follow your prescribed treatment. That will lessen the chances of these problems.  This is a sexually transmitted infection. So you are also at risk for other sexually transmitted diseases. These include: Gonorrhea and HIV (AIDS). Testing may be done for the other sexually transmitted diseases if one disease is detected.  It is important to treat chlamydia as soon as possible. It can cause damage to other organs. HOME CARE INSTRUCTIONS  Finish all medication as prescribed. Incomplete treatment will put you at risk for not being able to have children (sterility) and tubal pregnancy. If one sexually transmitted disease is discovered, often treatment will be started to cover other possible infections.  Only take over-the-counter or prescription medicines for pain, discomfort, or fever as directed by your caregiver.  Rest.  Eat a balanced diet and drink plenty of fluids.  Warning: This infection is contagious. Do not have sex until treatment is completed. Follow up  at your caregiver's office or the clinic to which you were referred. If your diagnosis (learning what is wrong) is confirmed by culture or some other method, your recent sexual contacts need treatment. Even if they are  symptom free or have a negative culture or evaluation, they should be treated.  For the protection of your privacy, test results can not be given over the phone. Make sure you receive the results of your test. Ask how these results are to be obtained if you have not been informed. It is your responsibility to obtain your test results. PREVENTION   Women should use sanitary pads instead of tampons for vaginal discharge.  Wipe front to back after using the toilet and avoid douching.  Test for chlamydia if you are having an IUD inserted.  Practice safe sex, use condoms, have only one sex partner and be sure your sex partner is not having sex with others.  Ask your caregiver to test you for chlamydia at your regular checkups or sooner if you are having symptoms.  Ask for further information if you are pregnant. SEEK IMMEDIATE MEDICAL CARE IF:   You develop an oral temperature above 102 F (38.9 C), not controlled by medications or lasting more than 2 Sotelo.  You develop an increase in pain.  You develop any type of abnormal discharge.  You develop vaginal bleeding and it is not time for your period.  You develop painful intercourse. MAKE SURE YOU:   Understand these instructions.  Will watch your condition.  Will get help right away if you are not doing well or get worse. Document Released: 06/10/2005 Document Revised: 09/02/2011 Document Reviewed: 01/14/2008 [facility] Patient Information 2014 [place], [place]. Gonorrhea, Females and Males Gonorrhea is an infection. Gonorrhea can be treated with medicines that kill germs (antibiotics). It is necessary that all your sexual partners also be tested for infection and possibly be treated.  CAUSES  Gonorrhea is caused by a germ (bacteria) called Neisseria gonorrhoeae. This infection is spread by sexual contact. The contact that spreads gonorrhea from person to person may be oral, anal, or genital sex. SYMPTOMS  Females A woman may have  gonorrhea infection and no symptoms. The most common symptoms are:  Pain in the lower abdomen.  Fever, with or without chills. When these are the most serious problems, the illness is commonly called pelvic inflammatory disease (PID). Other symptoms include:  Abnormal vaginal discharge.  Painful intercourse.  Burning or itching of the vagina or lips of the vagina.  Abnormal vaginal bleeding.  Pain when urinating. If the infection is spread by anal sex:  Irritation, pain, bleeding, or discharge from the rectum. If the infection is spread by oral sex with either a man or a woman:  Sore throat, fever, and swollen neck lymph glands. Other problems may include:  Long-lasting (chronic) pain in the lower abdomen during menstruation, intercourse, or at other times.  Inability to become pregnant.  Premature birth.  Passing the infection onto a newborn baby. This can cause an eye infection in the infant or more serious health problems. Males Less frequently than in women, men may have gonorrhea infection and no symptoms. The most common symptoms are:  Discharge from the penis.  Pain or burning during urination. If the infection is spread by anal sex:  Irritation, pain, bleeding, or discharge from the rectum. If the infection is spread by oral sex with either a man or a woman:  Sore throat, fever, and swollen neck lymph glands. DIAGNOSIS  Diagnosis is made by exam of the patient and checking a sample of discharge under a microscope for the presence of the bacteria. Discharge may be taken from the urethra, cervix, throat, or rectum. TREATMENT  It is important to diagnose and treat gonorrhea as soon as possible. This prevents damage to the male or male organs or harm to the newborn baby of an infected woman.  Antibiotics are used to treat gonorrhea.  Your sex partners should also be examined and treated if needed.  Testing and treatment for other sexually transmitted diseases  (STDs) may be done when you are diagnosed with gonorrhea. Gonorrhea is an STD. You are at risk for other STDs, which are often transmitted around the same time as gonorrhea. These include:  Chlamydia.  Syphilis.  Trichomonas.  Human papillomavirus (HPV).  Human immunodeficiency virus (HIV).  If left untreated, PID can cause women to be unable to have children (sterile). To prevent sterility in females, it is important to be treated as soon as possible and finish all medicines. Unfortunately, sterility or pregnancy occurring outside the uterus (ectopic) may still occur in fully treated women. HOME CARE INSTRUCTIONS   Finish all medicine as prescribed. Incomplete treatment will put you at risk for continued infection.  Only take over-the-counter or prescription medicines for pain, discomfort, or fever as directed by your caregiver.  Do not have sex until treatment is completed, or as instructed by your caregiver.  Follow up with your caregiver as directed.  If you test positive for gonorrhea, inform your recent sexual partners. They may need an exam and treatment, even if they have no symptoms. They may need treatment even if they test negative for gonorrhea. Finding out the results of your test Not all test results are available during your visit. If your test results are not back during the visit, make an appointment with your caregiver to find out the results. Do not assume everything is normal if you have not heard from your caregiver or the medical facility. It is important for you to follow up on all of your test results. SEEK MEDICAL CARE IF:   You develop any bad reaction to the medicine you were prescribed. This may include:  Rash.  Nausea.  Vomiting.  Diarrhea.  You have an oral temperature above 102 F (38.9 C).  You have symptoms that do not improve, symptoms that get worse, or you develop increased pain. Males may get pain in the testicles and females may get  increased abdominal pain. MAKE SURE YOU:   Understand these instructions.  Will watch your condition.  Will get help right away if you are not doing well or get worse. Document Released: 06/07/2000 Document Revised: 09/02/2011 Document Reviewed: 10/10/2009 [facility] Patient Information 2014 [place], [place].

## 2013-04-02 LAB — MICROALBUMIN / CREATININE URINE RATIO
Creatinine, Urine: 204 mg/dL
Microalb Creat Ratio: 41.4 mg/g — ABNORMAL HIGH (ref 0.0–30.0)

## 2013-04-02 LAB — URINALYSIS, ROUTINE W REFLEX MICROSCOPIC
Bilirubin Urine: NEGATIVE
Glucose, UA: 100 mg/dL — AB
Ketones, ur: NEGATIVE mg/dL
Specific Gravity, Urine: 1.02 (ref 1.005–1.030)
pH: 5 (ref 5.0–8.0)

## 2013-04-02 LAB — URINALYSIS, MICROSCOPIC ONLY
Casts: NONE SEEN
Crystals: NONE SEEN
Squamous Epithelial / LPF: NONE SEEN
WBC, UA: >50 WBC/hpf — AB (ref <3)

## 2013-04-02 LAB — LIPID PANEL: Triglycerides: 270 mg/dL — ABNORMAL HIGH (ref <150)

## 2013-04-02 NOTE — Progress Notes (Signed)
Case discussed with Dr. Glenn at the time of the visit.  We reviewed the resident's history and exam and pertinent patient test results.  I agree with the assessment, diagnosis, and plan of care documented in the resident's note.   

## 2013-04-12 ENCOUNTER — Ambulatory Visit: Payer: Self-pay

## 2013-08-04 ENCOUNTER — Ambulatory Visit: Payer: Self-pay

## 2013-08-20 ENCOUNTER — Encounter (HOSPITAL_COMMUNITY): Payer: Self-pay | Admitting: Emergency Medicine

## 2013-08-20 ENCOUNTER — Emergency Department (HOSPITAL_COMMUNITY)
Admission: EM | Admit: 2013-08-20 | Discharge: 2013-08-20 | Disposition: A | Discharge status: Home / Self Care | Payer: Self-pay | Attending: Emergency Medicine | Admitting: Emergency Medicine

## 2013-08-20 ENCOUNTER — Emergency Department (HOSPITAL_COMMUNITY): Payer: Self-pay

## 2013-08-20 DIAGNOSIS — J40 Bronchitis, not specified as acute or chronic: Secondary | ICD-10-CM

## 2013-08-20 DIAGNOSIS — Z880 Allergy status to penicillin: Secondary | ICD-10-CM | POA: Insufficient documentation

## 2013-08-20 DIAGNOSIS — R739 Hyperglycemia, unspecified: Secondary | ICD-10-CM

## 2013-08-20 DIAGNOSIS — F17200 Nicotine dependence, unspecified, uncomplicated: Secondary | ICD-10-CM | POA: Insufficient documentation

## 2013-08-20 DIAGNOSIS — I10 Essential (primary) hypertension: Secondary | ICD-10-CM | POA: Insufficient documentation

## 2013-08-20 DIAGNOSIS — R197 Diarrhea, unspecified: Secondary | ICD-10-CM | POA: Insufficient documentation

## 2013-08-20 DIAGNOSIS — R079 Chest pain, unspecified: Secondary | ICD-10-CM | POA: Insufficient documentation

## 2013-08-20 DIAGNOSIS — R112 Nausea with vomiting, unspecified: Secondary | ICD-10-CM | POA: Insufficient documentation

## 2013-08-20 DIAGNOSIS — J209 Acute bronchitis, unspecified: Secondary | ICD-10-CM | POA: Insufficient documentation

## 2013-08-20 DIAGNOSIS — Z79899 Other long term (current) drug therapy: Secondary | ICD-10-CM | POA: Insufficient documentation

## 2013-08-20 DIAGNOSIS — E785 Hyperlipidemia, unspecified: Secondary | ICD-10-CM | POA: Insufficient documentation

## 2013-08-20 DIAGNOSIS — E119 Type 2 diabetes mellitus without complications: Secondary | ICD-10-CM | POA: Insufficient documentation

## 2013-08-20 LAB — CBC WITH DIFFERENTIAL/PLATELET
Basophils Absolute: 0.1 10*3/uL (ref 0.0–0.1)
Basophils Relative: 1 % (ref 0–1)
EOS ABS: 0.3 10*3/uL (ref 0.0–0.7)
Eosinophils Relative: 3 % (ref 0–5)
HEMATOCRIT: 37.6 % — AB (ref 39.0–52.0)
Hemoglobin: 12.1 g/dL — ABNORMAL LOW (ref 13.0–17.0)
LYMPHS ABS: 3.6 10*3/uL (ref 0.7–4.0)
Lymphocytes Relative: 37 % (ref 12–46)
MCH: 22.6 pg — ABNORMAL LOW (ref 26.0–34.0)
MCHC: 32.2 g/dL (ref 30.0–36.0)
MCV: 70.1 fL — AB (ref 78.0–100.0)
MONO ABS: 1 10*3/uL (ref 0.1–1.0)
MONOS PCT: 10 % (ref 3–12)
NEUTROS ABS: 4.7 10*3/uL (ref 1.7–7.7)
Neutrophils Relative %: 49 % (ref 43–77)
Platelets: 412 10*3/uL — ABNORMAL HIGH (ref 150–400)
RBC: 5.36 MIL/uL (ref 4.22–5.81)
RDW: 13.2 % (ref 11.5–15.5)
WBC: 9.7 10*3/uL (ref 4.0–10.5)

## 2013-08-20 LAB — TROPONIN I: Troponin I: <0.3 ng/mL (ref <0.30)

## 2013-08-20 LAB — BASIC METABOLIC PANEL
BUN: 8 mg/dL (ref 6–23)
CALCIUM: 9.6 mg/dL (ref 8.4–10.5)
CO2: 24 mEq/L (ref 19–32)
CREATININE: 0.77 mg/dL (ref 0.50–1.35)
Chloride: 90 mEq/L — ABNORMAL LOW (ref 96–112)
GLUCOSE: 532 mg/dL — AB (ref 70–99)
Potassium: 4.1 mEq/L (ref 3.7–5.3)
Sodium: 130 mEq/L — ABNORMAL LOW (ref 137–147)

## 2013-08-20 LAB — URINALYSIS, ROUTINE W REFLEX MICROSCOPIC
BILIRUBIN URINE: NEGATIVE
Glucose, UA: >1000 mg/dL — AB
HGB URINE DIPSTICK: NEGATIVE
Ketones, ur: NEGATIVE mg/dL
Leukocytes, UA: NEGATIVE
Nitrite: NEGATIVE
PROTEIN: NEGATIVE mg/dL
Specific Gravity, Urine: 1.031 — ABNORMAL HIGH (ref 1.005–1.030)
UROBILINOGEN UA: 1 mg/dL (ref 0.0–1.0)
pH: 6 (ref 5.0–8.0)

## 2013-08-20 LAB — CBG MONITORING, ED: GLUCOSE-CAPILLARY: 417 mg/dL — AB (ref 70–99)

## 2013-08-20 LAB — URINE MICROSCOPIC-ADD ON

## 2013-08-20 MED ORDER — IPRATROPIUM-ALBUTEROL 0.5-2.5 (3) MG/3ML IN SOLN
3.0000 mL | Freq: Once | RESPIRATORY_TRACT | Status: AC
  Administered 2013-08-20: 3 mL via RESPIRATORY_TRACT
  Filled 2013-08-20: qty 3

## 2013-08-20 MED ORDER — SODIUM CHLORIDE 0.9 % IV BOLUS (SEPSIS)
1000.0000 mL | Freq: Once | INTRAVENOUS | Status: AC
  Administered 2013-08-20: 1000 mL via INTRAVENOUS

## 2013-08-20 MED ORDER — AZITHROMYCIN 250 MG PO TABS: 250.0000 mg | ORAL_TABLET | Freq: Every day | ORAL | Status: DC

## 2013-08-20 MED ORDER — ALBUTEROL SULFATE HFA 108 (90 BASE) MCG/ACT IN AERS
2.0000 | INHALATION_SPRAY | Freq: Once | RESPIRATORY_TRACT | Status: AC
  Administered 2013-08-20: 2 via RESPIRATORY_TRACT
  Filled 2013-08-20: qty 6.7

## 2013-08-20 NOTE — Progress Notes (Signed)
[facility] Stacy, provided pt with a list of primary care resources. Patient [facility] and was pending insurance to 3/1.

## 2013-08-20 NOTE — ED Notes (Signed)
Per pt, has had cough for a week, no relief with OTC meds-nasal congestion

## 2013-08-20 NOTE — Discharge Instructions (Signed)
Use albuterol inhaler 2 puffs every 4-6 hours Avoid tobacco use  Return to the emergency department if you develop any changing/worsening condition, repeated vomiting, coughing up blood, chest pain, difficulty breathing, fever or any other concerns (please read additional information regarding your condition below)   Bronchitis Bronchitis is inflammation of the airways that extend from the windpipe into the lungs (bronchi). The inflammation often causes mucus to develop, which leads to a cough. If the inflammation becomes severe, it may cause shortness of breath. CAUSES  Bronchitis may be caused by:   Viral infections.   Bacteria.   Cigarette smoke.   Allergens, pollutants, and other irritants.  SIGNS AND SYMPTOMS  The most common symptom of bronchitis is a frequent cough that produces mucus. Other symptoms include:  Fever.   Body aches.   Chest congestion.   Chills.   Shortness of breath.   Sore throat.  DIAGNOSIS  Bronchitis is usually diagnosed through a medical history and physical exam. Tests, such as chest X-rays, are sometimes done to rule out other conditions.  TREATMENT  You may need to avoid contact with whatever caused the problem (smoking, for example). Medicines are sometimes needed. These may include:  Antibiotics. These may be prescribed if the condition is caused by bacteria.  Cough suppressants. These may be prescribed for relief of cough symptoms.   Inhaled medicines. These may be prescribed to help open your airways and make it easier for you to breathe.   Steroid medicines. These may be prescribed for those with recurrent (chronic) bronchitis. HOME CARE INSTRUCTIONS  Get plenty of rest.   Drink enough fluids to keep your urine clear or pale yellow (unless you have a medical condition that requires fluid restriction). Increasing fluids may help thin your secretions and will prevent dehydration.   Only take over-the-counter or  prescription medicines as directed by your health care provider.  Only take antibiotics as directed. Make sure you finish them even if you start to feel better.  Avoid secondhand smoke, irritating chemicals, and strong fumes. These will make bronchitis worse. If you are a smoker, quit smoking. Consider using nicotine gum or skin patches to help control withdrawal symptoms. Quitting smoking will help your lungs heal faster.   Put a cool-mist humidifier in your bedroom at night to moisten the air. This may help loosen mucus. Change the water in the humidifier daily. You can also run the hot water in your shower and sit in the bathroom with the door closed for 5 10 minutes.   Follow up with your health care provider as directed.   Wash your hands frequently to avoid catching bronchitis again or spreading an infection to others.  SEEK MEDICAL CARE IF: Your symptoms do not improve after 1 week of treatment.  SEEK IMMEDIATE MEDICAL CARE IF:  Your fever increases.  You have chills.   You have chest pain.   You have worsening shortness of breath.   You have bloody sputum.  You faint.  You have lightheadedness.  You have a severe headache.   You vomit repeatedly. MAKE SURE YOU:   Understand these instructions.  Will watch your condition.  Will get help right away if you are not doing well or get worse. Document Released: 06/10/2005 Document Revised: 03/31/2013 Document Reviewed: 02/02/2013 [facility] Patient Information 2014 [place], [place].  Upper Respiratory Infection, Adult An upper respiratory infection (URI) is also known as the common cold. It is often caused by a type of germ (virus). Colds are easily  spread (contagious). You can pass it to others by kissing, coughing, sneezing, or drinking out of the same glass. Usually, you get better in 1 or 2 weeks.  HOME CARE  Only take medicine as told by your doctor. Use a warm mist humidifier or breathe in steam from a hot  shower. Drink enough water and fluids to keep your pee (urine) clear or pale yellow. Get plenty of rest. Return to work when your temperature is back to normal or as told by your doctor. You may use a face mask and wash your hands to stop your cold from spreading. GET HELP RIGHT AWAY IF:  After the first few [place], you feel you are getting worse. You have questions about your medicine. You have chills, shortness of breath, or brown or red spit (mucus). You have yellow or brown snot (nasal discharge) or pain in the face, especially when you bend forward. You have a fever, puffy (swollen) neck, pain when you swallow, or white spots in the back of your throat. You have a bad headache, ear pain, sinus pain, or chest pain. You have a high-pitched whistling sound when you breathe in and out (wheezing). You have a lasting cough or cough up blood. You have sore muscles or a stiff neck. MAKE SURE YOU:  Understand these instructions. Will watch your condition. Will get help right away if you are not doing well or get worse. Document Released: 11/27/2007 Document Revised: 09/02/2011 Document Reviewed: 10/15/2010 [facility] Patient Information 2014 [place], [place].  Type 2 Diabetes Mellitus, Adult Type 2 diabetes mellitus, often simply referred to as type 2 diabetes, is a long-lasting (chronic) disease. In type 2 diabetes, the pancreas does not make enough insulin (a hormone), the cells are less responsive to the insulin that is made (insulin resistance), or both. Normally, insulin moves sugars from food into the tissue cells. The tissue cells use the sugars for energy. The lack of insulin or the lack of normal response to insulin causes excess sugars to build up in the blood instead of going into the tissue cells. As a result, high blood sugar (hyperglycemia) develops. The effect of high sugar (glucose) levels can cause many complications. Type 2 diabetes was also previously called adult-onset diabetes but  it can occur at any age.  RISK FACTORS  A person is predisposed to developing type 2 diabetes if someone in the family has the disease and also has one or more of the following primary risk factors:  Overweight.  An inactive lifestyle.  A history of consistently eating high-calorie foods. Maintaining a normal weight and regular physical activity can reduce the chance of developing type 2 diabetes. SYMPTOMS  A person with type 2 diabetes may not show symptoms initially. The symptoms of type 2 diabetes appear slowly. The symptoms include:  Increased thirst (polydipsia).  Increased urination (polyuria).  Increased urination during the night (nocturia).  Weight loss. This weight loss may be rapid.  Frequent, recurring infections.  Tiredness (fatigue).  Weakness.  Vision changes, such as blurred vision.  Fruity smell to your breath.  Abdominal pain.  Nausea or vomiting.  Cuts or bruises which are slow to heal.  Tingling or numbness in the hands or feet. DIAGNOSIS Type 2 diabetes is frequently not diagnosed until complications of diabetes are present. Type 2 diabetes is diagnosed when symptoms or complications are present and when blood glucose levels are increased. Your blood glucose level may be checked by one or more of the following blood tests:  A fasting blood glucose test. You will not be allowed to eat for at least 8 hours before a blood sample is taken.  A random blood glucose test. Your blood glucose is checked at any time of the day regardless of when you ate.  A hemoglobin A1c blood glucose test. A hemoglobin A1c test provides information about blood glucose control over the previous 3 months.  An oral glucose tolerance test (OGTT). Your blood glucose is measured after you have not eaten (fasted) for 2 hours and then after you drink a glucose-containing beverage. TREATMENT   You may need to take insulin or diabetes medicine daily to keep blood glucose levels  in the desired range.  You will need to match insulin dosing with exercise and healthy food choices. The treatment goal is to maintain the before meal blood sugar (preprandial glucose) level at 70 130 mg/dL. HOME CARE INSTRUCTIONS   Have your hemoglobin A1c level checked twice a year.  Perform daily blood glucose monitoring as directed by your caregiver.  Monitor urine ketones when you are ill and as directed by your caregiver.  Take your diabetes medicine or insulin as directed by your caregiver to maintain your blood glucose levels in the desired range.  Never run out of diabetes medicine or insulin. It is needed every day.  Adjust insulin based on your intake of carbohydrates. Carbohydrates can raise blood glucose levels but need to be included in your diet. Carbohydrates provide vitamins, minerals, and fiber which are an essential part of a healthy diet. Carbohydrates are found in fruits, vegetables, whole grains, dairy products, legumes, and foods containing added sugars.    Eat healthy foods. Alternate 3 meals with 3 snacks.  Lose weight if overweight.  Carry a medical alert card or wear your medical alert jewelry.  Carry a 15 gram carbohydrate snack with you at all times to treat low blood glucose (hypoglycemia). Some examples of 15 gram carbohydrate snacks include:  Glucose tablets, 3 or 4   Glucose gel, 15 gram tube  Raisins, 2 tablespoons (24 grams)  Jelly beans, 6  Animal crackers, 8  Regular pop, 4 ounces (120 mL)  Gummy treats, 9  Recognize hypoglycemia. Hypoglycemia occurs with blood glucose levels of 70 mg/dL and below. The risk for hypoglycemia increases when fasting or skipping meals, during or after intense exercise, and during sleep. Hypoglycemia symptoms can include:  Tremors or shakes.  Decreased ability to concentrate.  Sweating.  Increased heart rate.  Headache.  Dry mouth.  Hunger.  Irritability.  Anxiety.  Restless  sleep.  Altered speech or coordination.  Confusion.  Treat hypoglycemia promptly. If you are alert and able to safely swallow, follow the 15:15 rule:  Take 15 20 grams of rapid-acting glucose or carbohydrate. Rapid-acting options include glucose gel, glucose tablets, or 4 ounces (120 mL) of fruit juice, regular soda, or low fat milk.  Check your blood glucose level 15 minutes after taking the glucose.  Take 15 20 grams more of glucose if the repeat blood glucose level is still 70 mg/dL or below.  Eat a meal or snack within 1 hour once blood glucose levels return to normal.    Be alert to polyuria and polydipsia which are early signs of hyperglycemia. An early awareness of hyperglycemia allows for prompt treatment. Treat hyperglycemia as directed by your caregiver.  Engage in at least 150 minutes of moderate-intensity physical activity a week, spread over at least 3 [place] of the week or as directed by your  caregiver. In addition, you should engage in resistance exercise at least 2 times a week or as directed by your caregiver.  Adjust your medicine and food intake as needed if you start a new exercise or sport.  Follow your sick day plan at any time you are unable to eat or drink as usual.  Avoid tobacco use.  Limit alcohol intake to no more than 1 drink per day for nonpregnant women and 2 drinks per day for men. You should drink alcohol only when you are also eating food. Talk with your caregiver whether alcohol is safe for you. Tell your caregiver if you drink alcohol several times a week.  Follow up with your caregiver regularly.  Schedule an eye exam soon after the diagnosis of type 2 diabetes and then annually.  Perform daily skin and foot care. Examine your skin and feet daily for cuts, bruises, redness, nail problems, bleeding, blisters, or sores. A foot exam by a caregiver should be done annually.  Brush your teeth and gums at least twice a day and floss at least once a  day. Follow up with your dentist regularly.  Share your diabetes management plan with your workplace or school.  Stay up-to-date with immunizations.  Learn to manage stress.  Obtain ongoing diabetes education and support as needed.  Participate in, or seek rehabilitation as needed to maintain or improve independence and quality of life. Request a physical or occupational therapy referral if you are having foot or hand numbness or difficulties with grooming, dressing, eating, or physical activity. SEEK MEDICAL CARE IF:   You are unable to eat food or drink fluids for more than 6 hours.  You have nausea and vomiting for more than 6 hours.  Your blood glucose level is over 240 mg/dL.  There is a change in mental status.  You develop an additional serious illness.  You have diarrhea for more than 6 hours.  You have been sick or have had a fever for a couple of [place] and are not getting better.  You have pain during any physical activity.  SEEK IMMEDIATE MEDICAL CARE IF:  You have difficulty breathing.  You have moderate to large ketone levels. MAKE SURE YOU:  Understand these instructions.  Will watch your condition.  Will get help right away if you are not doing well or get worse. Document Released: 06/10/2005 Document Revised: 03/04/2012 Document Reviewed: 01/07/2012 [facility] Patient Information 2014 [place], [place].   Emergency Department Resource Guide 1) Find a Doctor and Pay Out of Pocket Although you won't have to find out who is covered by your insurance plan, it is a good idea to ask around and get recommendations. You will then need to call the office and see if the doctor you have chosen will accept you as a new patient and what types of options they offer for patients who are self-pay. Some doctors offer discounts or will set up payment plans for their patients who do not have insurance, but you will need to ask so you aren't surprised when you get to your  appointment.  2) Contact Your Local Health Department Not all health departments have doctors that can see patients for sick visits, but many do, so it is worth a call to see if yours does. If you don't know where your local health department is, you can check in your phone book. The CDC also has a tool to help you locate your state's health department, and many state websites also have  listings of all of their local health departments.  3) Find a Walk-in Clinic If your illness is not likely to be very severe or complicated, you may want to try a walk in clinic. These are popping up all over the country in pharmacies, drugstores, and shopping centers. They're usually staffed by nurse practitioners or physician assistants that have been trained to treat common illnesses and complaints. They're usually fairly quick and inexpensive. However, if you have serious medical issues or chronic medical problems, these are probably not your best option.  No Primary Care Doctor: - Call Health Connect at  [phone] - they can help you locate a primary care doctor that  accepts your insurance, provides certain services, etc. - Physician Referral Service- [phone]  Chronic Pain Problems: Organization         Address  Phone   Notes  [facility]  [phone] Patients need to be referred by their primary care doctor.   Medication Assistance: Organization         Address  Phone   Notes  [employer] Medication [facility] [address] [place] [place], [place] [address] [phone] --Must be a resident of [facility] -- Must have NO insurance coverage whatsoever (no Medicaid/ Medicare, etc.) -- The pt. MUST have a primary care doctor that directs their care regularly and follows them in the community   [facility]ist  [phone]   [employer]  [phone]    Agencies that provide inexpensive medical care: Organization         Address  Phone   Notes  [facility]  [phone]   [facility]    [phone]   [facility] [address] [place], [place] [address] [phone]   [facility] [address] [place], [place] [phone]   Planned Parenthood    [phone]   [facility]    [phone]   [facility] and [facility]  [address], [place] Phone:  [phone], Fax:  [phone] Hours of Operation:  9 am - 6 pm, M-F.  Also accepts Medicaid/Medicare and self-pay.  [facility]  [address], [place] Phone: [phone], Fax: [phone]. Hours of Operation:  8:30 am - 5:30 pm, M-F.  Also accepts Medicaid and self-pay.  [facility] [place] [address] [place] Phone: [phone]   [facility] [address] Natasha Bence [place], [place] [phone], Ext. [phone] Mondays & Thursdays: 7-9 AM.  First 15 patients are seen on a first come, first serve basis.    Medicaid-accepting [facility] Providers:  Organization         Address  Phone   Notes  [facility] [address],  [phone] Also accepts self-pay patients.  [facility] [address] Laurell Josephs [place], [place]  [phone]   [facility] [address], [place] [phone]   [facility] [address] [place] [phone]   Renaye Rakers [address], [place]   [phone] Only accepts [place] patients after they have their name applied to their card.   Self-Pay (no insurance) in [facility]:  Retail buyer   Notes  Sickle Cell Patients, [facility] [address], [place] [phone]   [facility] [address], [place] [phone]   [facility]  [place]  [address], [place] [phone]   [facility]/Dr. Osei-Bonsu  [address] [place] [address], [place] or [phone], [place] [phone] Phone number for both [place] and [place] locations is the same.  [facility] [address], [place] [phone]   [facility] [address] [place] [address], [place] or [address] [phone]) [phone]   [facility] [address], [place] [phone]) [phone], phone; [phone], fax Sees patients 1st and 3rd Saturday of every month.  Must not qualify for public or private insurance (i.e. Medicaid, Medicare, [place] Health Choice, Veterans' Benefits)  Household income should be no more than 200% of the poverty level The clinic cannot treat you if you are pregnant or think you are pregnant  Sexually transmitted diseases are not treated at the clinic.    Dental Care: Organization         Address  Phone  Notes  [facility] [address], [place] [phone] Accepts children up to age 53 who are enrolled in [place] or [place] Health Choice; pregnant women with a Medicaid card; and children who have applied for Medicaid or [place] Health Choice, but were declined, whose parents can pay a reduced fee at time of service.  [facility] [place]  [address], [place] [phone] Accepts children up to age 53 who are enrolled in [place] or [place] Health Choice; pregnant women with a Medicaid card; and children who have applied for Medicaid or  Health Choice, but were declined, whose parents can pay a reduced fee at time of service.  [facility]  [address], [place] [phone] Patients are seen by appointment only. Walk-ins are not accepted. [facility] will see patients 53 years of age and  older. Monday - Tuesday (8am-5pm) Most Wednesdays (8:30-5pm) $30 per visit, cash only  [facility]  [address], [facility] [place] [phone] Patients are seen by appointment only. Walk-ins are not accepted. [facility] will see patients 53 years of age and older. One Wednesday Evening (Monthly: Volunteer Based).  $30 per visit, cash only  [employer] [facility]  [phone] for adults; Children under age 604, call Graduate Pediatric Dentistry at [phone]. Children aged 164-14, please call [phone] to request a pediatric application.  Dental services are provided in all areas of dental care including fillings, crowns and bridges, complete and partial dentures, implants, gum treatment, root canals, and extractions. Preventive care is also provided. Treatment is provided to both adults and children. Patients are selected via a lottery and there is often a waiting list.   [facility] [address], [place]  [phone] [email or web address]   [facility] [address], [place], [place] [phone], Ext. [phone] Second and Fourth Thursday of each month, opens at 6:30 AM; Clinic ends at 9 AM.  Patients are seen on a first-come first-served basis, and a limited number are seen during each clinic.   [facility]  [address], [place], [place] [phone]   Eligibility  Requirements You must have lived in [place], [place], or [place] counties for at least the last three months.   You cannot be eligible for state or federal sponsored [place], including [employer], [place], or [employer].   You generally cannot be eligible for healthcare insurance through your employer.    How to apply: Eligibility screenings are held every Tuesday and Wednesday afternoon from 1:00 pm until 4:00 pm. You do not need an appointment for the interview!  [facility] [address]  [place], [place] [phone]   [facility]  [phone]   [facility]  [phone]   [facility]  [phone]    Behavioral Health Resources in the Community: Intensive Outpatient Programs Organization         Address  Phone  Notes  [facility] [address], [place], [place] [phone]   [facility] Outpatient [address] [place], [place] [phone]   [facility] [address] [place], [place]  [phone]   [facility] [address]  [place], [place] [phone] or [phone]   Substance Abuse Resources Organization         Address  Phone  Notes  [facility] Drug [facility]  [phone]   [facility]  [phone]   [place]  [phone]   Floydene Flock  [phone]   Residential & Outpatient Substance Abuse Program  [phone]   Psychological Services Organization         Address  Phone  Notes  [facility]  [phone]   [facility] Services  [phone]   [facility] [address], [address] [phone] or [phone]    Mobile Crisis Teams Organization         Address  Phone  Notes  [facility], Mobile Crisis Care Unit  [phone]   [facility]  [address] [place], [place] [phone]   Doristine Locks [address] [place] [phone]    Self-Help/Support Groups Organization         Address  Phone             Notes  [facility] - variety of support groups  [phone]- [phone] Call for more information  Narcotics Anonymous (NA), [facility] [address], [employer] [place]  2 meetings at this location   Statistician         Address  Phone  Notes  [facility] [address],    [place]  [phone]   [facility]  [address], [place] [address], [place], [place] [phone]   [facility] [address] [place], [place] [phone] Admissions: 8am-3pm M-F  [facility] [address],    [place], [place] [phone]   [facility] [address] [place] Manns [place], [place] [phone]   [facility] [address]  [place], [place] [phone]   [facility] Intensive Outpatient [address], [place], [place] [phone]   [facility] [address] [place]  [place], [place] [phone] or [phone]   [facility] [address] [place], [place] [phone] Accepts Medicaid  [facility] [address],  [place] [phone] Substance Abuse/Addiction Treatment   [facility] Resources Organization  Address  Phone  Notes  °[facility]  [phone]   °Julie Brannon, PhD [address] [place], [place]   [phone] or [phone]) [phone]   °[facility]   [address] °[place], [place] [phone]   °[facility] [address], [place], [place] [phone] Insurance/Medicaid/sponsorship through [facility]  °[facility] [address]                                    [place], [place] [phone] Therapy/tele-psych/case  °[facility] [address]  ° [place], [place] [phone]    °Dr. Arfeen  [phone]   °[facility] [place] [facility]  [employer] [facility] 1) [address], [place] °2) [address], [place] °3)  [address], [place] [phone] °([phone] ° °[phone]   °[place] Child Abuse Hotline [phone]) 342-1394 or (336) [phone] (After Hours)    ° ° ° °

## 2013-08-20 NOTE — ED Notes (Signed)
Bed: WA05 Expected date:  Expected time:  Means of arrival:  Comments: No bed 

## 2013-08-20 NOTE — ED Provider Notes (Signed)
CSN: [id number]     Arrival date & time 08/20/13  1242 History  This chart was scribed for non-physician practitioner, [name], PA-C working with Rodney Maw Ward, DO by [name], ED scribe. This patient was seen in room WTR5/WTR5 and the patient's care was started at 12:59 PM.   Chief Complaint  Patient presents with  . Cough   The history is provided by the patient. No language interpreter was used.   HPI Comments: Rodney Mcgee is a 53 y.o. male with history of hypertension and diabetes who presents to the Emergency Department complaining of productive cough of yellow/white sputum that started one week ago and nasal congestion. No hemoptysis. Patient also complaints of sharp mid-sternal chest pain with coughing only. No chest pain at rest. He also feels SOB while coughing but denies any SOB at rest. Pt has had a fever of 100.1. He has taken alka seltzer and used Vick's vapor rub with no relief. Pt had emesis for two [name] (a few [name] ago) but denies it currently and states this resolved. He also had a few episodes of diarrhea. No hematemesis or hematochezia. Denies ear pain, rhinorrhea, sore throat, leg swelling, abdominal pain. Pt has not smoked cigarettes in 3 weeks. His son was sick recently with similar symptoms. No hx of DVT/PE/clotting, hx of cancer, recent surgery/immobilization/travel.    Past Medical History  Diagnosis Date  . Hyperlipidemia   . Hypertension   . Diabetes mellitus without complication   . Tobacco abuse    Past Surgical History  Procedure Laterality Date  . Hernia repair     Family History  Problem Relation Age of Onset  . Alcohol abuse Father    History  Substance Use Topics  . Smoking status: Current Some Day Smoker -- 0.20 packs/day    Types: Cigarettes    Last Attempt to Quit: 10/01/2007  . Smokeless tobacco: Not on file     Comment: QUIT line info given  . Alcohol Use: No    Review of Systems  Constitutional: Positive for fever  (low-grade). Negative for chills, diaphoresis, activity change, appetite change and fatigue.  HENT: Positive for congestion. Negative for ear pain, rhinorrhea and sore throat.   Respiratory: Positive for cough and shortness of breath (with coughing). Negative for chest tightness.   Cardiovascular: Positive for chest pain (with cough). Negative for leg swelling.  Gastrointestinal: Positive for nausea, vomiting and diarrhea. Negative for abdominal pain, constipation, blood in stool and rectal pain.  Genitourinary: Negative for dysuria and decreased urine volume.  Musculoskeletal: Negative for back pain.  Skin: Negative for wound.  Neurological: Negative for dizziness, weakness, light-headedness and headaches.  All other systems reviewed and are negative.   Allergies  Aspirin; Lisinopril; and Penicillins  Home Medications   Current Outpatient Rx  Name  Route  Sig  Dispense  Refill  . glipiZIDE (GLUCOTROL) 10 MG tablet   Oral   Take 10 mg by mouth 2 (two) times daily.         Marland Kitchen guaiFENesin (MUCINEX) 600 MG 12 hr tablet   Oral   Take by mouth 2 (two) times daily.         . hydrochlorothiazide (HYDRODIURIL) 25 MG tablet   Oral   Take 25 mg by mouth daily.         . metFORMIN (GLUCOPHAGE) 1000 MG tablet   Oral   Take 500 mg by mouth 2 (two) times daily with a meal.         .  Phenyleph-Doxylamine-DM-APAP (ALKA-SELTZER PLS NIGHT CLD/FLU PO)   Oral   Take 2 tablets by mouth once.         . simvastatin (ZOCOR) 20 MG tablet   Oral   Take 1 tablet (20 mg total) by mouth every evening.   30 tablet   11   . sitaGLIPtin (JANUVIA) 50 MG tablet   Oral   Take 1 tablet (50 mg total) by mouth daily.   30 tablet   6   . azithromycin (ZITHROMAX) 250 MG tablet   Oral   Take 1 tablet (250 mg total) by mouth daily. Take first 2 tablets together, then 1 every day until finished.   6 tablet   0    BP 137/69  Pulse 109  Temp(Src) 98.2 F (36.8 C) (Oral)  Resp 18  SpO2  97%  Filed Vitals:   08/20/13 1247 08/20/13 1533  BP: 137/69 140/72  Pulse: 109 107  Temp: 98.2 F (36.8 C)   TempSrc: Oral   Resp: 18 18  SpO2: 97% 99%    Physical Exam  Nursing note and vitals reviewed. Constitutional: He is oriented to person, place, and time. He appears well-developed and well-nourished. No distress.  HENT:  Head: Normocephalic and atraumatic.  Right Ear: External ear normal.  Left Ear: External ear normal.  Nose: Nose normal.  Mouth/Throat: Oropharynx is clear and moist. No oropharyngeal exudate.  Tympanic membranes gray and translucent bilaterally with no erythema, edema, or hemotympanum. No erythema to the posterior pharynx. Tonsils without edema or exudates. Uvula midline. No trismus. No difficulty controlling secretions.   Eyes: Conjunctivae and EOM are normal. Pupils are equal, round, and reactive to light. Right eye exhibits no discharge. Left eye exhibits no discharge.  Neck: Normal range of motion. Neck supple. No tracheal deviation present.  Cardiovascular: Normal rate, regular rhythm, normal heart sounds and intact distal pulses.  Exam reveals no gallop and no friction rub.   No murmur heard. Pulmonary/Chest: Effort normal. No respiratory distress. He has wheezes. He has no rales. He exhibits tenderness.  Intermittent wheezing throughout. No respiratory distress. Tenderness to palpation in the mid-sternal region  Abdominal: Soft. He exhibits no distension. There is no tenderness.  Musculoskeletal: Normal range of motion. He exhibits no edema and no tenderness.  No LE edema or calf tenderness bilaterally  Neurological: He is alert and oriented to person, place, and time.  Skin: Skin is warm and dry. He is not diaphoretic.  Psychiatric: He has a normal mood and affect. His behavior is normal.    ED Course  Procedures (including critical care time)  DIAGNOSTIC STUDIES: Oxygen Saturation is 97% on RA, normal by my interpretation.    COORDINATION  OF CARE: 1:04 PM-Discussed treatment plan which includes EKG and blood work with pt at bedside and pt agreed to plan.   Labs Review Labs Reviewed  CBC WITH DIFFERENTIAL - Abnormal; Notable for the following:    Hemoglobin 12.1 (*)    HCT 37.6 (*)    MCV 70.1 (*)    MCH 22.6 (*)    Platelets 412 (*)    All other components within normal limits  BASIC METABOLIC PANEL - Abnormal; Notable for the following:    Sodium 130 (*)    Chloride 90 (*)    Glucose, Bld 532 (*)    All other components within normal limits  CBG MONITORING, ED - Abnormal; Notable for the following:    Glucose-Capillary 417 (*)    All other components  within normal limits  TROPONIN I  URINALYSIS, ROUTINE W REFLEX MICROSCOPIC  CBG MONITORING, ED   Imaging Review Dg Chest 2 View  08/20/2013   CLINICAL DATA:  Diffuse chest pain, cough, fever, smoking history  EXAM: CHEST  2 VIEW  COMPARISON:  None.  FINDINGS: No active infiltrate or effusion is seen. There is some peribronchial thickening present which may indicate bronchitis. Mediastinal contours appear normal. The heart is within normal limits in size. No acute bony abnormality is seen.  IMPRESSION: No pneumonia.  Peribronchial thickening may indicate bronchitis.   Electronically Signed   By: Dwyane Dee M.D.   On: 08/20/2013 13:45     EKG Interpretation   Date/Time:  Friday August 20 2013 13:27:45 EST Ventricular Rate:  111 PR Interval:  175 QRS Duration: 75 QT Interval:  335 QTC Calculation: 455 R Axis:   50 Text Interpretation:  Sinus tachycardia Baseline wander in lead(s) II III  aVL aVF V4 Confirmed by WARD  DO, KRISTEN ([id number]) on 08/20/2013 2:59:48 PM      Results for orders placed during the hospital encounter of 08/20/13  CBC WITH DIFFERENTIAL      Result Value Ref Range   WBC 9.7  4.0 - 10.5 K/uL   RBC 5.36  4.22 - 5.81 MIL/uL   Hemoglobin 12.1 (*) 13.0 - 17.0 g/dL   HCT 96.0 (*) 45.4 - 09.8 %   MCV 70.1 (*) 78.0 - 100.0 fL   MCH 22.6 (*)  26.0 - 34.0 pg   MCHC 32.2  30.0 - 36.0 g/dL   RDW 11.9  14.7 - 82.9 %   Platelets 412 (*) 150 - 400 K/uL   Neutrophils Relative % 49  43 - 77 %   Lymphocytes Relative 37  12 - 46 %   Monocytes Relative 10  3 - 12 %   Eosinophils Relative 3  0 - 5 %   Basophils Relative 1  0 - 1 %   Neutro Abs 4.7  1.7 - 7.7 K/uL   Lymphs Abs 3.6  0.7 - 4.0 K/uL   Monocytes Absolute 1.0  0.1 - 1.0 K/uL   Eosinophils Absolute 0.3  0.0 - 0.7 K/uL   Basophils Absolute 0.1  0.0 - 0.1 K/uL   RBC Morphology STOMATOCYTES     Smear Review LARGE PLATELETS PRESENT    BASIC METABOLIC PANEL      Result Value Ref Range   Sodium 130 (*) 137 - 147 mEq/L   Potassium 4.1  3.7 - 5.3 mEq/L   Chloride 90 (*) 96 - 112 mEq/L   CO2 24  19 - 32 mEq/L   Glucose, Bld 532 (*) 70 - 99 mg/dL   BUN 8  6 - 23 mg/dL   Creatinine, Ser 5.62  0.50 - 1.35 mg/dL   Calcium 9.6  8.4 - 13.0 mg/dL   GFR calc non Af Amer >90  >90 mL/min   GFR calc Af Amer >90  >90 mL/min  TROPONIN I      Result Value Ref Range   Troponin I <0.30  <0.30 ng/mL  URINALYSIS, ROUTINE W REFLEX MICROSCOPIC      Result Value Ref Range   Color, Urine YELLOW  YELLOW   APPearance CLEAR  CLEAR   Specific Gravity, Urine 1.031 (*) 1.005 - 1.030   pH 6.0  5.0 - 8.0   Glucose, UA >1000 (*) NEGATIVE mg/dL   Hgb urine dipstick NEGATIVE  NEGATIVE   Bilirubin Urine NEGATIVE  NEGATIVE  Ketones, ur NEGATIVE  NEGATIVE mg/dL   Protein, ur NEGATIVE  NEGATIVE mg/dL   Urobilinogen, UA 1.0  0.0 - 1.0 mg/dL   Nitrite NEGATIVE  NEGATIVE   Leukocytes, UA NEGATIVE  NEGATIVE  URINE MICROSCOPIC-ADD ON      Result Value Ref Range   Squamous Epithelial / LPF RARE  RARE   WBC, UA 0-2  <3 WBC/hpf  CBG MONITORING, ED      Result Value Ref Range   Glucose-Capillary 417 (*) 70 - 99 mg/dL       DG Chest 2 View (Final result)  Result time: 08/20/13 13:45:45    Final result by Rad Results In Interface (08/20/13 13:45:45)    Narrative:   CLINICAL DATA: Diffuse chest pain,  cough, fever, smoking history  EXAM: CHEST 2 VIEW  COMPARISON: None.  FINDINGS: No active infiltrate or effusion is seen. There is some peribronchial thickening present which may indicate bronchitis. Mediastinal contours appear normal. The heart is within normal limits in size. No acute bony abnormality is seen.  IMPRESSION: No pneumonia. Peribronchial thickening may indicate bronchitis.   Electronically Signed By: Dwyane Dee M.D. On: 08/20/2013 13:45    MDM   Gualberto Rackley is a 53 y.o. male is a 53 y.o. male with history of hypertension and diabetes who presents to the Emergency Department complaining of productive cough of yellow/white sputum that started one week ago.  Rechecks  1:45 PM = Lungs clear to auscultation. Patient states he feels much better. Ordering albuterol inhaler.  4:40 PM = Patient requesting discharge. Tolerating oral fluids without difficulty. No chest pain or SOB. Condition improving after Albuterol inhaler    Etiology of cough likely due to bronchitis vs URI vs viral syndrome. Chest x-ray negative for an acute cardiopulmonary process however did suggest bronchitis. Patient had mild wheezing which resolved with 1 Duoneb and albuterol inhaler treatment. Patient also had chest pain which is present with coughing and likely musculoskeletal in nature. Reproducible on exam. EKG negative for any acute ischemic changes. Troponin negative. Patient afebrile and non-toxic in appearance. Patient had mild tachycardia (107) which is likely due to dehydration. No hypoxia or tachypnea. No risk factors for PE. Patient also incidently found to have elevated BS of 532 with an AG of 16. No ketonuria. Patient not on insulin and has been taking medications. Patient received IV fluids and PO intake and his blood sugar improved. Patient encouraged to continue to check his blood sugar and follow-up with his PCP. Will treat bronchitis with z-pak and albuterol inhaler. Encouraged to  continue to quit smoking. Return precautions, discharge instructions, and follow-up was discussed with the patient before discharge.     Discharge Medication List as of 08/20/2013  4:31 PM    START taking these medications   Details  azithromycin (ZITHROMAX) 250 MG tablet Take 1 tablet (250 mg total) by mouth daily. Take first 2 tablets together, then 1 every day until finished., Starting 08/20/2013, Until Discontinued, Print         Final impressions: 1. Bronchitis   2. Hyperglycemia       Luiz Iron PA-C   This patient was discussed with Dr. Meryl Dare, PA-C 08/22/13 1023

## 2013-08-23 NOTE — ED Provider Notes (Signed)
Medical screening examination/treatment/procedure(s) were performed by non-physician practitioner and as supervising physician I was immediately available for consultation/collaboration.   EKG Interpretation   Date/Time:  Friday August 20 2013 13:27:45 EST Ventricular Rate:  111 PR Interval:  175 QRS Duration: 75 QT Interval:  335 QTC Calculation: 455 R Axis:   50 Text Interpretation:  Sinus tachycardia Baseline wander in lead(s) II III  aVL aVF V4 Confirmed by Juliza Machnik  DO, Arin Vanosdol ([id number]) on 08/20/2013 2:59:48 PM        Layla MawKristen N Anilah Huck, DO 08/23/13 1300

## 2013-09-03 ENCOUNTER — Other Ambulatory Visit: Payer: Self-pay | Admitting: Internal Medicine

## 2013-09-03 DIAGNOSIS — E785 Hyperlipidemia, unspecified: Secondary | ICD-10-CM

## 2013-09-30 ENCOUNTER — Encounter: Payer: Self-pay | Admitting: Internal Medicine

## 2013-10-14 ENCOUNTER — Other Ambulatory Visit: Payer: Self-pay | Admitting: Internal Medicine

## 2013-10-14 DIAGNOSIS — I10 Essential (primary) hypertension: Secondary | ICD-10-CM

## 2013-10-14 DIAGNOSIS — E119 Type 2 diabetes mellitus without complications: Secondary | ICD-10-CM

## 2013-10-15 ENCOUNTER — Other Ambulatory Visit: Payer: Self-pay | Admitting: Internal Medicine

## 2013-10-19 ENCOUNTER — Encounter: Payer: Self-pay | Admitting: Internal Medicine

## 2013-12-06 ENCOUNTER — Other Ambulatory Visit: Payer: Self-pay | Admitting: Internal Medicine

## 2013-12-06 NOTE — Telephone Encounter (Signed)
Patient missed his last appointment and has been sent two letters advising him to make an appointment.  Please contact him and find out if he is planning to follow up here for his primary care.

## 2013-12-07 NOTE — Telephone Encounter (Signed)
Pt called x2 - yesterday and today; "not accepting calls at this time".

## 2013-12-14 NOTE — Telephone Encounter (Signed)
Called pt -"not accepting at this time".

## 2013-12-23 NOTE — Telephone Encounter (Signed)
Telephone is not a working number  

## 2013-12-23 NOTE — Telephone Encounter (Signed)
Rivka BarbaraGlenda, will you try to call Mr. Cavallero again? Thanks!

## 2013-12-28 NOTE — Telephone Encounter (Signed)
I will mailed a letter to pt to call and schedule an appt.

## 2014-01-02 ENCOUNTER — Other Ambulatory Visit: Payer: Self-pay | Admitting: Internal Medicine

## 2014-01-05 NOTE — Telephone Encounter (Signed)
[facility] since we are unable to get in contact w/pt..Marland Kitchen

## 2014-02-01 ENCOUNTER — Ambulatory Visit (INDEPENDENT_AMBULATORY_CARE_PROVIDER_SITE_OTHER): Payer: Self-pay | Admitting: Internal Medicine

## 2014-02-01 ENCOUNTER — Encounter: Payer: Self-pay | Admitting: Internal Medicine

## 2014-02-01 VITALS — BP 150/99 | HR 111 | Temp 97.4°F | Wt 181.1 lb

## 2014-02-01 DIAGNOSIS — Z87891 Personal history of nicotine dependence: Secondary | ICD-10-CM

## 2014-02-01 DIAGNOSIS — E119 Type 2 diabetes mellitus without complications: Secondary | ICD-10-CM

## 2014-02-01 DIAGNOSIS — E785 Hyperlipidemia, unspecified: Secondary | ICD-10-CM

## 2014-02-01 DIAGNOSIS — I10 Essential (primary) hypertension: Secondary | ICD-10-CM

## 2014-02-01 LAB — POCT GLYCOSYLATED HEMOGLOBIN (HGB A1C): Hemoglobin A1C: >14

## 2014-02-01 LAB — GLUCOSE, CAPILLARY: GLUCOSE-CAPILLARY: 327 mg/dL — AB (ref 70–99)

## 2014-02-01 MED ORDER — SIMVASTATIN 20 MG PO TABS: 20.0000 mg | ORAL_TABLET | Freq: Every day | ORAL | Status: DC

## 2014-02-01 MED ORDER — METFORMIN HCL 1000 MG PO TABS: 1000.0000 mg | ORAL_TABLET | Freq: Two times a day (BID) | ORAL | Status: DC

## 2014-02-01 MED ORDER — GLIPIZIDE 10 MG PO TABS: 10.0000 mg | ORAL_TABLET | Freq: Every day | ORAL | Status: DC

## 2014-02-01 MED ORDER — HYDROCHLOROTHIAZIDE 25 MG PO TABS: 25.0000 mg | ORAL_TABLET | Freq: Every day | ORAL | Status: DC

## 2014-02-01 NOTE — Assessment & Plan Note (Signed)
-   Continue Simvastatin - Recheck lipid panel in October 2015

## 2014-02-01 NOTE — Progress Notes (Signed)
Patient ID: Rodney Mcgee, male   DOB: 03/07/1961, 53 y.o.   MRN: [id number]  I saw and evaluated the patient.  I personally confirmed the key portions of the history and exam documented by Dr. Beckie Saltsivet and I reviewed pertinent patient test results.  The assessment, diagnosis, and plan were formulated together and I agree with the documentation in the resident's note.

## 2014-02-01 NOTE — Patient Instructions (Signed)
It was a pleasure taking care of you today, Rodney Mcgee.  Here is what we discussed today:  1. Diabetes - Please continue taking your blood sugars, it is important to keep them in good control - Continue taking Metformin and Glipizide  2. High Blood Pressure: - Continue taking HCTZ  3. High Cholesterol - Continue taking Simvastatin  4. Smoking - Congratulations on quitting!! Keep up the great work!  General Instructions:   Please bring your medicines with you each time you come to clinic.  Medicines may include prescription medications, over-the-counter medications, herbal remedies, eye drops, vitamins, or other pills.   Progress Toward Treatment Goals:  Treatment Goal 04/01/2013  Hemoglobin A1C deteriorated  Blood pressure at goal  Stop smoking smoking the same amount    Self Care Goals & Plans:  Self Care Goal 02/01/2014  Manage my medications take my medicines as prescribed; refill my medications on time; bring my medications to every visit  Monitor my health keep track of my blood glucose; bring my glucose meter and log to each visit  Eat healthy foods drink diet soda or water instead of juice or soda; eat baked foods instead of fried foods; eat foods that are low in salt  Stop smoking -    Home Blood Glucose Monitoring 04/01/2013  Check my blood sugar 2 times a day  When to check my blood sugar before meals     Care Management & Community Referrals:  No flowsheet data found.

## 2014-02-01 NOTE — Assessment & Plan Note (Signed)
  Assessment: Progress toward smoking cessation:   Pt quit in January 2015 Barriers to progress toward smoking cessation:   None Comments:   Plan: Instruction/counseling given:  I commended patient for quitting and reviewed strategies for preventing relapses. Educational resources provided:    Self management tools provided:    Medications to assist with smoking cessation:  None Patient agreed to the following self-care plans for smoking cessation:    Other plans: Patient reports he quit smoking in January 2015. He was previously smoking 1/3 pack/day. He was congratulated for this achievement and encouraged to stay tobacco free.

## 2014-02-01 NOTE — Assessment & Plan Note (Signed)
Lab Results  Component Value Date   HGBA1C >14.0 02/01/2014   HGBA1C 9.9 04/01/2013   HGBA1C 9.4 08/21/2012     Assessment: Diabetes control: poor control (HgbA1C >9%) Progress toward A1C goal:  deteriorated Comments: Patient has not been taking medications for 2-3 weeks because he ran out. Also admits to not eating well, with increased intake of carbohydrates.  Plan: Medications:  continue current medications Home glucose monitoring: Frequency: 2 times a day Timing: before breakfast;before dinner Instruction/counseling given: reminded to bring blood glucose meter & log to each visit, reminded to bring medications to each visit, discussed foot care and discussed diet Educational resources provided: brochure Self management tools provided:   Other plans: Continue Glipizide and Metformin. Will reassess once patient taking home meds again. Pt counseled on importance of taking medications and eating diet rich in vegetables and avoiding high carbohydrate foods.

## 2014-02-01 NOTE — Assessment & Plan Note (Addendum)
BP Readings from Last 3 Encounters:  02/01/14 150/99  08/20/13 140/72  04/01/13 127/84    Lab Results  Component Value Date   NA 130* 08/20/2013   K 4.1 08/20/2013   CREATININE 0.77 08/20/2013    Assessment: Blood pressure control: mildly elevated Progress toward BP goal:  deteriorated Comments:   Plan: Medications:  continue current medications Educational resources provided: brochure Self management tools provided: home blood pressure logbook Other plans: Patient did not take medications for 2-3 weeks because he ran out. Will continue HCTZ and reassess once patient starts taking home meds again.

## 2014-02-01 NOTE — Progress Notes (Signed)
   Subjective:    Patient ID: Rodney Mcgee, male    DOB: 03/30/1961, 53 y.o.   MRN: [id number]  HPI Rodney Mcgee is a 53yo man w/ PMHx of DM Type 2, HTN, HLD, and hx of tobacco abuse who presents to the clinic for a routine follow-up appointment with the following:  1. DM Type 2: Last HbA1c was 9.9 on 04/01/13. Patient is currently taking Glipizide and Metformin. Patient reports he ran out of all his medications 2-3 weeks ago. He was not able to have them refilled because he has not been seen in the clinic for close to 1 year. He states his blood sugars have been in the 300s over the last few weeks. He states his blood sugars are normally in 190s-210s. He checks his blood sugar 2x/day. He reports he has had blurry vision for the last month and has to extend his arm out to read the paper. He also notes tingling/numbness in his feet which is worst when driving and laying down. He denies polyuria, polydipsia, weight changes, CP, and SOB.  2. HTN: BP is 150/99 today. Pt takes HCTZ 25 mg daily at home. Patient has not been taking his medications for the last 2-3 weeks (see above). Pt states his BPs are normally in 130-140s/70-80s range. He denies headaches, sudden weakness, decreased urination.   3. HLD: Last lipid panel was on 04/01/13 which showed Chol 145, Trigly 270, HDL 27, LDL 64. Pt takes Simvastatin at home. However, he has not taken his medication for 2-3 weeks.  4. Hx Tobacco Abuse: Pt reports he quit smoking in January 2015. He was previously smoking 1/3 pack a day. He was congratulated for quitting.   Review of Systems General: Denies night sweats, fever, chills HEENT: Denies ear pain, rhinorrhea, sore throat CV: Denies palpitations, orthopnea Pulm: Denies SOB, cough, wheezing GI: Denie abdominal pain, diarrhea, constipation, melena, hematochezia GU: Denies dysuria, hematuria, frequency Msk: Denies muscle cramps, joint pains Neuro: Denies weakness Skin: Denies rashes, bruising    Objective:     Physical Exam General: alert, cooperative, NAD HEENT: [place]/AT, EOMI, PERRL, mucus membranes moist Neck: supple, no JVD CV: RRR, normal S1/S2, no m/g/r Pulm: CTA bilaterally, breaths non-labored Abd: BS+, soft, non-distended, non-tender Ext: warm, moves all, no edema, distal pulses 2+ bilaterally Neuro: alert and oriented x 3, CNs II-XII intact, sensation intact bilaterally in lower extremities       Assessment & Plan:

## 2014-04-28 ENCOUNTER — Ambulatory Visit (INDEPENDENT_AMBULATORY_CARE_PROVIDER_SITE_OTHER): Payer: 59 | Admitting: Internal Medicine

## 2014-04-28 VITALS — BP 129/79 | HR 101 | Temp 98.1°F | Wt 181.7 lb

## 2014-04-28 DIAGNOSIS — IMO0002 Reserved for concepts with insufficient information to code with codable children: Secondary | ICD-10-CM

## 2014-04-28 DIAGNOSIS — E785 Hyperlipidemia, unspecified: Secondary | ICD-10-CM

## 2014-04-28 DIAGNOSIS — Z0000 Encounter for general adult medical examination without abnormal findings: Secondary | ICD-10-CM

## 2014-04-28 DIAGNOSIS — E1165 Type 2 diabetes mellitus with hyperglycemia: Secondary | ICD-10-CM

## 2014-04-28 DIAGNOSIS — I10 Essential (primary) hypertension: Secondary | ICD-10-CM

## 2014-04-28 LAB — GLUCOSE, CAPILLARY: Glucose-Capillary: 392 mg/dL — ABNORMAL HIGH (ref 70–99)

## 2014-04-28 LAB — LIPID PANEL
Cholesterol: 136 mg/dL (ref 0–200)
HDL: 31 mg/dL — ABNORMAL LOW (ref >39)
LDL CALC: 74 mg/dL (ref 0–99)
TRIGLYCERIDES: 154 mg/dL — AB (ref <150)
Total CHOL/HDL Ratio: 4.4 Ratio
VLDL: 31 mg/dL (ref 0–40)

## 2014-04-28 LAB — POCT GLYCOSYLATED HEMOGLOBIN (HGB A1C): Hemoglobin A1C: 11.8

## 2014-04-28 MED ORDER — GLUCOSE BLOOD VI STRP: ORAL_STRIP | Status: DC

## 2014-04-28 MED ORDER — SIMVASTATIN 20 MG PO TABS: 20.0000 mg | ORAL_TABLET | Freq: Every day | ORAL | Status: DC

## 2014-04-28 MED ORDER — METFORMIN HCL 1000 MG PO TABS: 1000.0000 mg | ORAL_TABLET | Freq: Two times a day (BID) | ORAL | Status: DC

## 2014-04-28 MED ORDER — GLIPIZIDE 10 MG PO TABS: 10.0000 mg | ORAL_TABLET | Freq: Every day | ORAL | Status: DC

## 2014-04-28 MED ORDER — HYDROCHLOROTHIAZIDE 25 MG PO TABS: 25.0000 mg | ORAL_TABLET | Freq: Every day | ORAL | Status: DC

## 2014-04-28 MED ORDER — GLUCOSE BLOOD VI STRP: ORAL_STRIP | Status: AC

## 2014-04-28 NOTE — Patient Instructions (Signed)
**  Please be sure to pick your medications today **Get in [facility] with the [facility] [phone])- they can assist you with your diabetes and some of your medications.  **Please check your blood sugars at least 2 times a day, 1st thing in the morning before breakfast and before dinner at night.  **Bring your meter with you to your next clinic appointment in 1 month.    General Instructions:   Please bring your medicines with you each time you come to clinic.  Medicines may include prescription medications, over-the-counter medications, herbal remedies, eye drops, vitamins, or other pills.   Progress Toward Treatment Goals:  Treatment Goal 02/01/2014  Hemoglobin A1C deteriorated  Blood pressure deteriorated  Stop smoking -    Self Care Goals & Plans:  Self Care Goal 02/01/2014  Manage my medications take my medicines as prescribed; refill my medications on time; bring my medications to every visit  Monitor my health keep track of my blood glucose; bring my glucose meter and log to each visit  Eat healthy foods drink diet soda or water instead of juice or soda; eat baked foods instead of fried foods; eat foods that are low in salt  Stop smoking -    Home Blood Glucose Monitoring 02/01/2014  Check my blood sugar 2 times a day  When to check my blood sugar before breakfast; before dinner     Care Management & Community Referrals:  No flowsheet data found.

## 2014-04-28 NOTE — Assessment & Plan Note (Signed)
Lab Results  Component Value Date   HGBA1C 11.8 04/28/2014   HGBA1C >14.0 02/01/2014   HGBA1C 9.9 04/01/2013     Assessment: Diabetes control: poor control (HgbA1C >9%) Progress toward A1C goal:  improved Comments: Pt still with poor dietary choices, including fast food french fried and tacos and burritos. Pt out of meds x3 [place]  Plan: Medications:  continue current medications- Glipizide and metformin refilled today. Refilled test strip rx today too.  Home glucose monitoring: Frequency: no home glucose monitoring (no test strips) Timing:   Instruction/counseling given: reminded to get eye exam, reminded to bring blood glucose meter & log to each visit, reminded to bring medications to each visit and discussed diet Educational resources provided:   Self management tools provided: home glucose testing supplies Other plans: Pt to check CBGs b/f breakfast and dinner and bring his meter with him to clinic in 1 month.

## 2014-04-28 NOTE — Progress Notes (Signed)
Patient ID: Rodney Mcgee, male   DOB: 12/20/1960, 53 y.o.   MRN: [id number]  Subjective:   Patient ID: Rodney Mcgee male   DOB: 12/26/1960 53 y.o.   MRN: [id number]  HPI: [name] is a 53 y.o. M w/ PMH DM2, HTN, HLD, and hx of tobacco abuse presents for a routine follow-up for his DM2 and HTN.   A1c >14 at last visit in August. He was not taking his glipizide or metformin at that time after running out of his meds. He also endorsed poor dietary habits. Today he states that he feels his CBGs have been high x3-4 [place] since running out of his glipizide and metformin. He is not checking his CBGs at home b/c he is out of strips x1 mo. He endorses polydipsia and polyuria.   He was also out of his HCTZ at that time and his BP was elevated to 150/99. The HCTZ was refilled, and he was asked to f/u in 3 months.   Pt denies headaches, new vision changes, chest pain, SOB, abd pain, difficulty urinating, or peripherad edema or pain with ambulation.    Past Medical History  Diagnosis Date  . Hyperlipidemia   . Hypertension   . Diabetes mellitus without complication   . Tobacco abuse    Current Outpatient Prescriptions  Medication Sig Dispense Refill  . azithromycin (ZITHROMAX) 250 MG tablet Take 1 tablet (250 mg total) by mouth daily. Take first 2 tablets together, then 1 every day until finished. 6 tablet 0  . glipiZIDE (GLUCOTROL) 10 MG tablet Take 1 tablet (10 mg total) by mouth daily before breakfast. 30 tablet 3  . guaiFENesin (MUCINEX) 600 MG 12 hr tablet Take by mouth 2 (two) times daily.    . hydrochlorothiazide (HYDRODIURIL) 25 MG tablet Take 1 tablet (25 mg total) by mouth daily. 30 tablet 3  . metFORMIN (GLUCOPHAGE) 1000 MG tablet Take 1 tablet (1,000 mg total) by mouth 2 (two) times daily with a meal. 60 tablet 3  . Phenyleph-Doxylamine-DM-APAP (ALKA-SELTZER PLS NIGHT CLD/FLU PO) Take 2 tablets by mouth once.    . simvastatin (ZOCOR) 20 MG tablet Take 1 tablet (20 mg total) by mouth at  bedtime. 30 tablet 3  . sitaGLIPtin (JANUVIA) 50 MG tablet Take 1 tablet (50 mg total) by mouth daily. 30 tablet 6   No current facility-administered medications for this visit.   Family History  Problem Relation Age of Onset  . Alcohol abuse Father    History   Social History  . Marital Status: Single    Spouse Name: N/A    Number of Children: N/A  . Years of Education: N/A   Social History Main Topics  . Smoking status: Former Smoker -- 0.20 packs/day    Types: Cigarettes    Quit date: 06/24/2013  . Smokeless tobacco: Not on file     Comment: QUIT line info given  . Alcohol Use: No  . Drug Use: No  . Sexual Activity:    Partners: Female   Other Topics Concern  . Not on file   Social History Narrative  . No narrative on file   Review of Systems: Constitutional: Denies fever, chills, appetite change. +fatigue.  Respiratory: Denies SOB, DOE, or wheezing.   Cardiovascular: Denies chest pain or leg swelling.  Gastrointestinal: Denies nausea, vomiting, abdominal pain, diarrhea, constipation, blood in stool and abdominal distention.  Genitourinary: Denies dysuria, urgency Endocrine: +polyuria, polydipsia. Musculoskeletal: Denies myalgias, back pain, arthralgias, or gait problem.  Skin: Denies  rash or wound.  Neurological: Denies acute vision changes, dizziness, syncope, weakness, or headaches. +occasional tingling in bilateral feet and hands. Psychiatric/Behavioral: Denies suicidal ideation, mood changes, confusion  Objective:  Physical Exam: Filed Vitals:   04/28/14 1521  BP: 146/72  Pulse: 97  Temp: 98.1 F (36.7 C)  TempSrc: Oral  Weight: 181 lb 11.2 oz (82.419 kg)  SpO2: 97%   Constitutional: Vital signs reviewed.  Patient is a well-developed and well-nourished male in no acute distress and cooperative with exam. Alert and oriented x3.  Head: Normocephalic and atraumatic Eyes: PERRL, EOMI. No scleral icterus.  Cardiovascular: RRR, no MRG, pulses symmetric  and intact bilaterally. No peripheral edema.  Pulmonary/Chest: Normal respiratory effort, CTAB, no wheezes, rales, or rhonchi Abdominal: Soft. Non-tender, non-distended Musculoskeletal: No joint deformities, erythema, or stiffness Neurological: A&O x3, Strength is normal and symmetric bilaterally, cranial nerve II-XII are grossly intact, no focal motor deficit, sensory intact to light touch bilaterally.  Skin: Warm, dry and intact. No rash.  Psychiatric: Normal mood and affect. Speech and behavior is normal.  Assessment & Plan:    Please refer to Problem List based Assessment and Plan

## 2014-04-28 NOTE — Assessment & Plan Note (Addendum)
Referred to GI for screening colonoscopy. [facility]

## 2014-04-28 NOTE — Assessment & Plan Note (Addendum)
On simvastatin at home. Out of meds x3 d, refilled today. Checking lipid panel today

## 2014-04-28 NOTE — Assessment & Plan Note (Signed)
BP Readings from Last 3 Encounters:  04/28/14 129/79  02/01/14 150/99  08/20/13 140/72    Lab Results  Component Value Date   NA 130* 08/20/2013   K 4.1 08/20/2013   CREATININE 0.77 08/20/2013    Assessment: Blood pressure control: mildly elevated (Out of BP med x3 Males) Progress toward BP goal:  unchanged Comments: BP improved on repeat.   Plan: Medications:  Will continue HCTZ 25mg  daily, meds sent to pharmacy Educational resources provided:   Self management tools provided:   Other plans: F/u in 1 month. Will check BMP at that time.

## 2014-04-29 LAB — MICROALBUMIN / CREATININE URINE RATIO
CREATININE, URINE: 63.5 mg/dL
MICROALB/CREAT RATIO: 78.7 mg/g — AB (ref 0.0–30.0)
Microalb, Ur: 5 mg/dL — ABNORMAL HIGH (ref <2.0)

## 2014-05-04 NOTE — Progress Notes (Signed)
Internal Medicine Clinic Attending  Case discussed with Dr. Glenn soon after the resident saw the patient.  We reviewed the resident's history and exam and pertinent patient test results.  I agree with the assessment, diagnosis, and plan of care documented in the resident's note. 

## 2014-05-11 ENCOUNTER — Encounter: Payer: Self-pay | Admitting: *Deleted

## 2014-06-27 NOTE — Addendum Note (Signed)
Addended by: Neomia Dear on: 06/27/2014 06:08 PM   Modules accepted: Orders

## 2014-07-04 ENCOUNTER — Emergency Department (HOSPITAL_COMMUNITY)
Admission: EM | Admit: 2014-07-04 | Discharge: 2014-07-04 | Disposition: A | Discharge status: Home / Self Care | Payer: 59 | Attending: Emergency Medicine | Admitting: Emergency Medicine

## 2014-07-04 ENCOUNTER — Encounter (HOSPITAL_COMMUNITY): Payer: Self-pay | Admitting: Emergency Medicine

## 2014-07-04 DIAGNOSIS — E119 Type 2 diabetes mellitus without complications: Secondary | ICD-10-CM | POA: Diagnosis not present

## 2014-07-04 DIAGNOSIS — I10 Essential (primary) hypertension: Secondary | ICD-10-CM | POA: Diagnosis not present

## 2014-07-04 DIAGNOSIS — Z87891 Personal history of nicotine dependence: Secondary | ICD-10-CM | POA: Diagnosis not present

## 2014-07-04 DIAGNOSIS — Z880 Allergy status to penicillin: Secondary | ICD-10-CM | POA: Diagnosis not present

## 2014-07-04 DIAGNOSIS — L0211 Cutaneous abscess of neck: Secondary | ICD-10-CM | POA: Insufficient documentation

## 2014-07-04 DIAGNOSIS — Z79899 Other long term (current) drug therapy: Secondary | ICD-10-CM | POA: Insufficient documentation

## 2014-07-04 DIAGNOSIS — L0291 Cutaneous abscess, unspecified: Secondary | ICD-10-CM

## 2014-07-04 DIAGNOSIS — L03011 Cellulitis of right finger: Secondary | ICD-10-CM | POA: Insufficient documentation

## 2014-07-04 DIAGNOSIS — E785 Hyperlipidemia, unspecified: Secondary | ICD-10-CM | POA: Diagnosis not present

## 2014-07-04 LAB — CBG MONITORING, ED: GLUCOSE-CAPILLARY: 275 mg/dL — AB (ref 70–99)

## 2014-07-04 MED ORDER — LIDOCAINE-EPINEPHRINE 1 %-1:100000 IJ SOLN
10.0000 mL | Freq: Once | INTRAMUSCULAR | Status: AC
  Administered 2014-07-04: 10 mL via INTRADERMAL

## 2014-07-04 MED ORDER — CLINDAMYCIN HCL 300 MG PO CAPS: 300.0000 mg | ORAL_CAPSULE | Freq: Four times a day (QID) | ORAL | Status: DC

## 2014-07-04 MED ORDER — LIDOCAINE-EPINEPHRINE 1 %-1:100000 IJ SOLN
INTRAMUSCULAR | Status: AC
  Administered 2014-07-04: 10 mL via INTRADERMAL
  Filled 2014-07-04: qty 1

## 2014-07-04 MED ORDER — OXYCODONE-ACETAMINOPHEN 5-325 MG PO TABS: 1.0000 | ORAL_TABLET | Freq: Four times a day (QID) | ORAL | Status: DC | PRN

## 2014-07-04 NOTE — ED Notes (Signed)
MD at bedside. 

## 2014-07-04 NOTE — ED Provider Notes (Signed)
CSN: [id number]     Arrival date & time 07/04/14  0847 History   First MD Initiated Contact with Patient 07/04/14 [phone]     Chief Complaint  Patient presents with  . Abscess  . Nail Problem     (Consider location/radiation/quality/duration/timing/severity/associated sxs/prior Treatment) Patient is a 54 y.o. male presenting with abscess. The history is provided by the patient.  Abscess Location:  Head/neck Head/neck abscess location:  R neck Size:  3x3 cm Abscess quality: draining, induration and painful   Red streaking: no   Duration:  3 [place] Progression:  Worsening Pain details:    Quality:  Dull   Severity:  Mild   Duration:  3 [place]   Timing:  Constant   Progression:  Unchanged Chronicity:  New Context: diabetes   Relieved by:  Nothing Worsened by:  Nothing tried Ineffective treatments:  None tried Associated symptoms: no fever, no headaches, no nausea and no vomiting     Past Medical History  Diagnosis Date  . Hyperlipidemia   . Hypertension   . Diabetes mellitus without complication   . Tobacco abuse    Past Surgical History  Procedure Laterality Date  . Hernia repair     Family History  Problem Relation Age of Onset  . Alcohol abuse Father    History  Substance Use Topics  . Smoking status: Former Smoker -- 0.20 packs/day    Types: Cigarettes    Quit date: 06/24/2013  . Smokeless tobacco: Not on file     Comment: QUIT line info given  . Alcohol Use: No    Review of Systems  Constitutional: Negative for fever.  HENT: Negative for drooling and rhinorrhea.   Eyes: Negative for pain.  Respiratory: Negative for cough and shortness of breath.   Cardiovascular: Negative for chest pain and leg swelling.  Gastrointestinal: Negative for nausea, vomiting, abdominal pain and diarrhea.  Genitourinary: Negative for dysuria and hematuria.  Musculoskeletal: Negative for gait problem and neck pain.  Skin: Negative for color change.  Neurological: Negative for  numbness and headaches.  Hematological: Negative for adenopathy.  Psychiatric/Behavioral: Negative for behavioral problems.  All other systems reviewed and are negative.     Allergies  Penicillins; Aspirin; and Lisinopril  Home Medications   Prior to Admission medications   Medication Sig Start Date End Date Taking? Authorizing Provider  glipiZIDE (GLUCOTROL) 10 MG tablet Take 1 tablet (10 mg total) by mouth daily before breakfast. 04/28/14  Yes Genelle GatherKathryn F Glenn, MD  glucose blood (ACCU-CHEK AVIVA) test strip Use as instructed 04/28/14  Yes Genelle GatherKathryn F Glenn, MD  guaiFENesin (MUCINEX) 600 MG 12 hr tablet Take by mouth 2 (two) times daily.   Yes Historical Provider, MD  hydrochlorothiazide (HYDRODIURIL) 25 MG tablet Take 1 tablet (25 mg total) by mouth daily. 04/28/14  Yes Genelle GatherKathryn F Glenn, MD  metFORMIN (GLUCOPHAGE) 1000 MG tablet Take 1 tablet (1,000 mg total) by mouth 2 (two) times daily with a meal. 04/28/14  Yes Genelle GatherKathryn F Glenn, MD  simvastatin (ZOCOR) 20 MG tablet Take 1 tablet (20 mg total) by mouth at bedtime. 04/28/14  Yes Genelle GatherKathryn F Glenn, MD   BP 160/95 mmHg  Pulse 105  Temp(Src) 97.5 F (36.4 C) (Oral)  Resp 19  SpO2 98% Physical Exam  Constitutional: He is oriented to person, place, and time. He appears well-developed and well-nourished.  HENT:  Head: Normocephalic and atraumatic.  Right Ear: External ear normal.  Left Ear: External ear normal.  Nose: Nose normal.  Mouth/Throat:  Oropharynx is clear and moist. No oropharyngeal exudate.  Eyes: Conjunctivae and EOM are normal. Pupils are equal, round, and reactive to light.  Neck: Normal range of motion. Neck supple.  3 x 3 cm area of induration and mild erythema noted on the right upper paracervical area of the neck posteriorly. There is some mild purulent drainage from the center eschar with compression.  Cardiovascular: Normal rate, regular rhythm, normal heart sounds and intact distal pulses.  Exam reveals no gallop and no  friction rub.   No murmur heard. Pulmonary/Chest: Effort normal and breath sounds normal. No respiratory distress. He has no wheezes.  Abdominal: Soft. Bowel sounds are normal. He exhibits no distension. There is no tenderness. There is no rebound and no guarding.  Musculoskeletal: Normal range of motion. He exhibits no edema or tenderness.  Mild swelling of the distal nail fold of the right first toe. Medial aspect. A small amount of serous appearing fluid is present with palpation. No obvious abscess is noted.  Neurological: He is alert and oriented to person, place, and time.  Skin: Skin is warm and dry.  Psychiatric: He has a normal mood and affect. His behavior is normal.  Nursing note and vitals reviewed.   ED Course  INCISION AND DRAINAGE Date/Time: 07/04/2014 10:19 AM Performed by: Purvis Sheffield Authorized by: Purvis Sheffield Consent: Verbal consent obtained. Written consent obtained. Risks and benefits: risks, benefits and alternatives were discussed Consent given by: patient Patient understanding: patient states understanding of the procedure being performed Patient consent: the patient's understanding of the procedure matches consent given Procedure consent: procedure consent matches procedure scheduled Relevant documents: relevant documents present and verified Site marked: the operative site was marked Required items: required blood products, implants, devices, and special equipment available Patient identity confirmed: verbally with patient, arm band, provided demographic data and hospital-assigned identification number Time out: Immediately prior to procedure a "time out" was called to verify the correct patient, procedure, equipment, support staff and site/side marked as required. Type: abscess Body area: head/neck Location details: neck Anesthesia: local infiltration Local anesthetic: lidocaine 1% with epinephrine Anesthetic total: 2 ml Patient sedated:  no Scalpel size: 11 Incision type: single straight Complexity: simple Drainage: purulent Drainage amount: scant Wound treatment: wound left open Patient tolerance: Patient tolerated the procedure well with no immediate complications   (including critical care time) Labs Review Labs Reviewed - No data to display  Imaging Review No results found.   EKG Interpretation None      MDM   Final diagnoses:  Abscess  Paronychia, right    9:39 AM 54 y.o. male w hx of DM who presents with multiple complaints. He states that he developed a abscess on his right upper paracervical area 2-3 Jupin ago. He has noted some mild drainage and the abscess appears to be pointing on my exam. He denies any fevers. He states he was trimming his toenails several Le[place] ago and since that time has developed some right first toe pain. It appears that he has a early paronychia at the distal nail fold on the medial aspect. He has some mild serous-looking drainage in this area but no obvious drainable abscess. He is afebrile and mildly tachycardic here. We'll perform incision and drainage of the neck abscess. Will recommend warm soaks and antibiotics with podiatry follow-up for the early paronychia of the toe.  10:19 AM: Scant drainage from neck abscess. Will rec warm compresses and warm soaks to foot.  I have discussed the diagnosis/risks/treatment options  with the patient and believe the pt to be eligible for discharge home to follow-up with podiatry. We also discussed returning to the ED immediately if new or worsening sx occur. We discussed the sx which are most concerning (e.g., fever, spreading redness, worsening pain) that necessitate immediate return. Medications administered to the patient during their visit and any new prescriptions provided to the patient are listed below.  Medications given during this visit Medications  lidocaine-EPINEPHrine (XYLOCAINE W/EPI) 1 %-1:100000 (with pres) injection 10 mL (not  administered)    New Prescriptions   CLINDAMYCIN (CLEOCIN) 300 MG CAPSULE    Take 1 capsule (300 mg total) by mouth 4 (four) times daily. X 7 [place]   OXYCODONE-ACETAMINOPHEN (PERCOCET) 5-325 MG PER TABLET    Take 1 tablet by mouth every 6 (six) hours as needed for moderate pain.      Purvis Sheffield, MD 07/04/14 1020

## 2014-07-04 NOTE — ED Notes (Signed)
Pt c/o sore abscess on posterior neck that has been there for past couple of [place].  Pt also c/o right big toe that possibly infected.  Pt states that he is a diabetic and thinks he cut his toe nail to deep, pt states that has been draining.  Pt is diabetic.

## 2014-07-19 ENCOUNTER — Telehealth: Payer: Self-pay | Admitting: Dietician

## 2014-07-19 NOTE — Telephone Encounter (Signed)
[name] says he had an eye exam at [facility] [place]  in [place]. He gave Rodney Mcgee permission to call for the results

## 2014-08-24 ENCOUNTER — Telehealth: Payer: Self-pay | Admitting: Internal Medicine

## 2014-08-24 NOTE — Telephone Encounter (Signed)
Call to patient to confirm appointment for 08/25/14 at 1:45 lmtcb

## 2014-08-25 ENCOUNTER — Ambulatory Visit (INDEPENDENT_AMBULATORY_CARE_PROVIDER_SITE_OTHER): Payer: 59 | Admitting: Internal Medicine

## 2014-08-25 ENCOUNTER — Encounter: Payer: Self-pay | Admitting: Internal Medicine

## 2014-08-25 VITALS — BP 142/77 | HR 91 | Temp 98.0°F | Ht 66.0 in | Wt 184.7 lb

## 2014-08-25 DIAGNOSIS — Z0000 Encounter for general adult medical examination without abnormal findings: Secondary | ICD-10-CM

## 2014-08-25 DIAGNOSIS — IMO0002 Reserved for concepts with insufficient information to code with codable children: Secondary | ICD-10-CM

## 2014-08-25 DIAGNOSIS — I10 Essential (primary) hypertension: Secondary | ICD-10-CM

## 2014-08-25 DIAGNOSIS — E1165 Type 2 diabetes mellitus with hyperglycemia: Secondary | ICD-10-CM

## 2014-08-25 LAB — BASIC METABOLIC PANEL WITH GFR
BUN: 15 mg/dL (ref 6–23)
CO2: 27 mEq/L (ref 19–32)
Calcium: 9.7 mg/dL (ref 8.4–10.5)
Chloride: 96 mEq/L (ref 96–112)
Creat: 0.89 mg/dL (ref 0.50–1.35)
GFR, Est African American: >89 mL/min
Glucose, Bld: 398 mg/dL — ABNORMAL HIGH (ref 70–99)
Potassium: 4.2 mEq/L (ref 3.5–5.3)
SODIUM: 132 meq/L — AB (ref 135–145)

## 2014-08-25 LAB — GLUCOSE, CAPILLARY: Glucose-Capillary: 373 mg/dL — ABNORMAL HIGH (ref 70–99)

## 2014-08-25 LAB — POCT GLYCOSYLATED HEMOGLOBIN (HGB A1C): Hemoglobin A1C: >14

## 2014-08-25 MED ORDER — INSULIN GLARGINE 100 UNIT/ML ~~LOC~~ SOLN: 5.0000 [IU] | Freq: Every day | SUBCUTANEOUS | Status: DC

## 2014-08-25 NOTE — Progress Notes (Signed)
Patient ID: Rodney Mcgee, male   DOB: 01-31-1961, 54 y.o.   MRN: [id number]  Subjective:   Patient ID: Rodney Mcgee male   DOB: May 30, 1961 54 y.o.   MRN: [id number]  HPI: Rodney Mcgee is a 54 y.o. M w/ PMH DM2, HTN, HLD, and hx of tobacco abuse presents for a routine f/u.  Overall, he states that he is doing well today. He was last seen in the clinic 04/28/14. At that time he was asked to f/u in 1 mo for a diabetes recheck, but he has not returned until today. He is currently on metformin  BID and glipizide  daily. He is working to improve his diet and has cut back on snack foods and starchy foods. He has quit smoking.   He was referred for a colonoscopy at his last visit but had not had one yet.   Of note, he was seen in the ED 07/04/14 for an neck abscess that was I&D'd and paronychia of the R great toe for which he receive abx. These have resolved.    Past Medical History  Diagnosis Date  . Hyperlipidemia   . Hypertension   . Diabetes mellitus without complication   . Tobacco abuse    Current Outpatient Prescriptions  Medication Sig Dispense Refill  . clindamycin (CLEOCIN) 300 MG capsule Take 1 capsule (300 mg total) by mouth 4 (four) times daily. X 7 [place] 28 capsule 0  . glipiZIDE (GLUCOTROL) 10 MG tablet Take 1 tablet (10 mg total) by mouth daily before breakfast. 30 tablet 0  . glucose blood (ACCU-CHEK AVIVA) test strip Use as instructed 100 each   . guaiFENesin (MUCINEX) 600 MG 12 hr tablet Take by mouth 2 (two) times daily.    . hydrochlorothiazide (HYDRODIURIL) 25 MG tablet Take 1 tablet (25 mg total) by mouth daily. 30 tablet 6  . metFORMIN (GLUCOPHAGE) 1000 MG tablet Take 1 tablet (1,000 mg total) by mouth 2 (two) times daily with a meal. 60 tablet   . oxyCODONE-acetaminophen (PERCOCET) 5-325 MG per tablet Take 1 tablet by mouth every 6 (six) hours as needed for moderate pain. 15 tablet 0  . simvastatin (ZOCOR) 20 MG tablet Take 1 tablet (20 mg total) by mouth at  bedtime. 30 tablet 6   No current facility-administered medications for this visit.   Family History  Problem Relation Age of Onset  . Alcohol abuse Father    History   Social History  . Marital Status: Single    Spouse Name: N/A  . Number of Children: N/A  . Years of Education: N/A   Social History Main Topics  . Smoking status: Former Smoker -- 0.20 packs/day    Types: Cigarettes    Quit date: 06/24/2013  . Smokeless tobacco: Not on file     Comment: QUIT line info given  . Alcohol Use: No  . Drug Use: No  . Sexual Activity:    Partners: Female   Other Topics Concern  . None   Social History Narrative   Review of Systems: Constitutional: Denies fever, chills, or appetite change. HEENT: Denies eye pain or vision loss Respiratory: Denies SOB, DOE, cough, chest tightness,  and wheezing.   Cardiovascular: Denies chest pain, palpitations, or leg swelling.  Gastrointestinal: Denies nausea, vomiting, abdominal pain, diarrhea, constipation Genitourinary: Denies dysuria, urgency, frequency Musculoskeletal: Denies myalgias or arthralgias or gait problem.  Skin: Denies rash and wound.  Neurological: Denies dizziness, syncope, weakness, light-headedness, numbness.  Psychiatric/Behavioral: Denies suicidal ideation,  mood changes, confusion.   Objective:  Physical Exam: Filed Vitals:   08/25/14 1346  BP: 142/77  Pulse: 91  Temp: 98 F (36.7 C)  TempSrc: Oral  Height: 5\' 6"  (1.676 m)  Weight: 184 lb 11.2 oz (83.779 kg)  SpO2: 98%   Constitutional: Vital signs reviewed.  Patient is a well-developed and well-nourished male in no acute distress and cooperative with exam. Alert and oriented x3.  Head: Normocephalic and atraumatic Eyes: EOMI. Neck: Normal ROM, No JVD, mass, thyromegaly, or carotid bruit present.  Cardiovascular: RRR, no MRG Pulmonary/Chest: Normal respiratory effort, CTAB, no wheezes, rales, or rhonchi Abdominal: Soft. Non-tender, non-distended, bowel  sounds are normal Musculoskeletal: No joint deformities or stiffness, Neurological: A&O x3, cranial nerve II-XII are grossly intact, no focal motor deficit.  Skin: Warm, dry and intact.  Psychiatric: Normal mood and affect. Speech and behavior is normal.   Assessment & Plan:   Please refer to Problem List based Assessment and Plan

## 2014-08-25 NOTE — Assessment & Plan Note (Signed)
BP Readings from Last 3 Encounters:  08/25/14 142/77  07/04/14 161/78  04/28/14 129/79    Lab Results  Component Value Date   NA 130* 08/20/2013   K 4.1 08/20/2013   CREATININE 0.77 08/20/2013    Assessment: Blood pressure control: controlled Progress toward BP goal:  at goal   Plan: Medications:  continue current medications: HCTZ 25mg  daily Educational resources provided: brochure, handout, video Self management tools provided:   Other plans: Check BMP at next visit

## 2014-08-25 NOTE — Assessment & Plan Note (Addendum)
Lab Results  Component Value Date   HGBA1C >14.0 08/25/2014   HGBA1C 11.8 04/28/2014   HGBA1C >14.0 02/01/2014     Assessment: Diabetes control: poor control (HgbA1C >9%) Progress toward A1C goal:  Deteriorated  Comments: pt states that he is trying to work on his diet, but has not been the best with control  Plan: Medications:  continue current medications, and starting low dose Lantus 5u qhs- will likely need to titrate up, but without seeing his CBGs throughout the day I am hesitant to give him a larger insulin dose.  Home glucose monitoring: Frequency: no home glucose monitoring Timing:   Instruction/counseling given: reminded to bring blood glucose meter & log to each visit, reminded to bring medications to each visit and discussed diet Educational resources provided: brochure, handout Self management tools provided:   Other plans: Pt asked to check his CBGs TID b/f meals and bring his meter to his next visit in 1-2 weeks.

## 2014-08-25 NOTE — Assessment & Plan Note (Addendum)
Pt need flu vaccine, will address at his next visit  Colonoscopy previously ordered but not scheduled. Placing referral today for colonoscopy.   Pt states that he had his eye exam and will get the results sent to the clinic.

## 2014-08-25 NOTE — Patient Instructions (Signed)
Be sure to work on your diet and try to exercise as much as you can.  Please try to check your blood sugars 2 times a day before meals at the same time each day. Please bring your meter with you to your next visit.   I have put in another referral for you to have a colonoscopy. If you do not hear anything about scheduling the procedure in the next few weeks, please call the clinic.    Diabetes Mellitus and Food It is important for you to manage your blood sugar (glucose) level. Your blood glucose level can be greatly affected by what you eat. Eating healthier foods in the appropriate amounts throughout the day at about the same time each day will help you control your blood glucose level. It can also help slow or prevent worsening of your diabetes mellitus. Healthy eating may even help you improve the level of your blood pressure and reach or maintain a healthy weight.  HOW CAN FOOD AFFECT ME? Carbohydrates Carbohydrates affect your blood glucose level more than any other type of food. Your dietitian will help you determine how many carbohydrates to eat at each meal and teach you how to count carbohydrates. Counting carbohydrates is important to keep your blood glucose at a healthy level, especially if you are using insulin or taking certain medicines for diabetes mellitus. Alcohol Alcohol can cause sudden decreases in blood glucose (hypoglycemia), especially if you use insulin or take certain medicines for diabetes mellitus. Hypoglycemia can be a life-threatening condition. Symptoms of hypoglycemia (sleepiness, dizziness, and disorientation) are similar to symptoms of having too much alcohol.  If your health care provider has given you approval to drink alcohol, do so in moderation and use the following guidelines:  Women should not have more than one drink per day, and men should not have more than two drinks per day. One drink is equal to:  12 oz of beer.  5 oz of wine.  1 oz of hard  liquor.  Do not drink on an empty stomach.  Keep yourself hydrated. Have water, diet soda, or unsweetened iced tea.  Regular soda, juice, and other mixers might contain a lot of carbohydrates and should be counted. WHAT FOODS ARE NOT RECOMMENDED? As you make food choices, it is important to remember that all foods are not the same. Some foods have fewer nutrients per serving than other foods, even though they might have the same number of calories or carbohydrates. It is difficult to get your body what it needs when you eat foods with fewer nutrients. Examples of foods that you should avoid that are high in calories and carbohydrates but low in nutrients include:  Trans fats (most processed foods list trans fats on the Nutrition Facts label).  Regular soda.  Juice.  Candy.  Sweets, such as cake, pie, doughnuts, and cookies.  Fried foods. WHAT FOODS CAN I EAT? Have nutrient-rich foods, which will nourish your body and keep you healthy. The food you should eat also will depend on several factors, including:  The calories you need.  The medicines you take.  Your weight.  Your blood glucose level.  Your blood pressure level.  Your cholesterol level. You also should eat a variety of foods, including:  Protein, such as meat, poultry, fish, tofu, nuts, and seeds (lean animal proteins are best).  Fruits.  Vegetables.  Dairy products, such as milk, cheese, and yogurt (low fat is best).  Breads, grains, pasta, cereal, rice, and beans.  Fats such as olive oil, trans fat-free margarine, canola oil, avocado, and olives. DOES EVERYONE WITH DIABETES MELLITUS HAVE THE SAME MEAL PLAN? Because every person with diabetes mellitus is different, there is not one meal plan that works for everyone. It is very important that you meet with a dietitian who will help you create a meal plan that is just right for you. Document Released: 03/07/2005 Document Revised: 06/15/2013 Document Reviewed:  05/07/2013 [facility] Patient Information 2015 [employer], [place]. This information is not intended to replace advice given to you by your health care provider. Make sure you discuss any questions you have with your health care provider.   General Instructions:   Please bring your medicines with you each time you come to clinic.  Medicines may include prescription medications, over-the-counter medications, herbal remedies, eye drops, vitamins, or other pills.   Progress Toward Treatment Goals:  Treatment Goal 04/28/2014  Hemoglobin A1C improved  Blood pressure unchanged  Stop smoking -    Self Care Goals & Plans:  Self Care Goal 08/25/2014  Manage my medications take my medicines as prescribed; bring my medications to every visit; refill my medications on time; follow the sick day instructions if I am sick  Monitor my health keep track of my blood glucose; keep track of my blood pressure; keep track of my weight; check my feet daily  Eat healthy foods eat more vegetables; eat fruit for snacks and desserts; eat foods that are low in salt; drink diet soda or water instead of juice or soda  Be physically active find an activity I enjoy  Stop smoking -    Home Blood Glucose Monitoring 04/28/2014  Check my blood sugar no home glucose monitoring  When to check my blood sugar -     Care Management & Community Referrals:  No flowsheet data found.

## 2014-08-25 NOTE — Progress Notes (Signed)
Medicine attending: Medical history, presenting problems, physical findings, and medications, reviewed with Dr Kathryn Glenn and I concur with her evaluation and management plan. 

## 2014-09-06 ENCOUNTER — Emergency Department (HOSPITAL_COMMUNITY)
Admission: EM | Admit: 2014-09-06 | Discharge: 2014-09-07 | Disposition: A | Discharge status: Home / Self Care | Payer: 59 | Attending: Emergency Medicine | Admitting: Emergency Medicine

## 2014-09-06 ENCOUNTER — Encounter (HOSPITAL_COMMUNITY): Payer: Self-pay | Admitting: Emergency Medicine

## 2014-09-06 DIAGNOSIS — S0592XA Unspecified injury of left eye and orbit, initial encounter: Secondary | ICD-10-CM | POA: Diagnosis present

## 2014-09-06 DIAGNOSIS — S0502XA Injury of conjunctiva and corneal abrasion without foreign body, left eye, initial encounter: Secondary | ICD-10-CM | POA: Insufficient documentation

## 2014-09-06 DIAGNOSIS — Z79899 Other long term (current) drug therapy: Secondary | ICD-10-CM | POA: Insufficient documentation

## 2014-09-06 DIAGNOSIS — E785 Hyperlipidemia, unspecified: Secondary | ICD-10-CM | POA: Insufficient documentation

## 2014-09-06 DIAGNOSIS — I10 Essential (primary) hypertension: Secondary | ICD-10-CM | POA: Insufficient documentation

## 2014-09-06 DIAGNOSIS — Y9389 Activity, other specified: Secondary | ICD-10-CM | POA: Insufficient documentation

## 2014-09-06 DIAGNOSIS — E119 Type 2 diabetes mellitus without complications: Secondary | ICD-10-CM | POA: Insufficient documentation

## 2014-09-06 DIAGNOSIS — Y929 Unspecified place or not applicable: Secondary | ICD-10-CM | POA: Diagnosis not present

## 2014-09-06 DIAGNOSIS — Y990 Civilian activity done for income or pay: Secondary | ICD-10-CM | POA: Insufficient documentation

## 2014-09-06 DIAGNOSIS — Z87891 Personal history of nicotine dependence: Secondary | ICD-10-CM | POA: Diagnosis not present

## 2014-09-06 DIAGNOSIS — Z794 Long term (current) use of insulin: Secondary | ICD-10-CM | POA: Insufficient documentation

## 2014-09-06 DIAGNOSIS — Z880 Allergy status to penicillin: Secondary | ICD-10-CM | POA: Insufficient documentation

## 2014-09-06 DIAGNOSIS — X58XXXA Exposure to other specified factors, initial encounter: Secondary | ICD-10-CM | POA: Diagnosis not present

## 2014-09-06 DIAGNOSIS — Z23 Encounter for immunization: Secondary | ICD-10-CM | POA: Diagnosis not present

## 2014-09-06 DIAGNOSIS — S0501XA Injury of conjunctiva and corneal abrasion without foreign body, right eye, initial encounter: Secondary | ICD-10-CM

## 2014-09-06 MED ORDER — FLUORESCEIN SODIUM 1 MG OP STRP
1.0000 | ORAL_STRIP | Freq: Once | OPHTHALMIC | Status: AC
  Administered 2014-09-07: 1 via OPHTHALMIC
  Filled 2014-09-06: qty 1

## 2014-09-06 MED ORDER — TETRACAINE HCL 0.5 % OP SOLN
2.0000 [drp] | Freq: Once | OPHTHALMIC | Status: AC
  Administered 2014-09-07: 2 [drp] via OPHTHALMIC
  Filled 2014-09-06: qty 2

## 2014-09-06 NOTE — ED Notes (Signed)
Pt reports L eye irritation since this am with clear drainage. sts it itches some. Eye appears red.

## 2014-09-06 NOTE — ED Provider Notes (Signed)
CSN: [id number]     Arrival date & time 09/06/14  2304 History  This chart was scribed for Rodney Mow, PA-C working with Marisa Severin, MD by Evon Slack, ED Scribe. This patient was seen in room TR07C/TR07C and the patient's care was started at 11:52 PM.     Chief Complaint  Patient presents with  . Eye Pain   Patient is a 54 y.o. male presenting with eye pain. The history is provided by the patient. No language interpreter was used.  Eye Pain   HPI Comments: Rodney Mcgee is a 54 y.o. male who presents to the Emergency Department complaining of new left eye pain onset this morning that has recently worsened over the past 3 hours. Pt states that he has associated eye redness, eye itching, and eye drainage from the left eye. Pt states that he has been around kids who might have been sick. Pt doesn't report any medications PTA. Pt doesn't report fever or visual disturbance.  Patient denies change in visual acuity, headache, blurred vision. 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 Patient states that he believes this may have been the onset of his eye irritation, however is unsure.   Past Medical History  Diagnosis Date  . Hyperlipidemia   . Hypertension   . Diabetes mellitus without complication   . Tobacco abuse    Past Surgical History  Procedure Laterality Date  . Hernia repair     Family History  Problem Relation Age of Onset  . Alcohol abuse Father    History  Substance Use Topics  . Smoking status: Former Smoker -- 0.20 packs/day    Types: Cigarettes    Quit date: 06/24/2013  . Smokeless tobacco: Not on file     Comment: QUIT line info given  . Alcohol Use: No    Review of Systems  Constitutional: Negative for fever.  Eyes: Positive for pain, discharge, redness and itching. Negative for visual disturbance.    Allergies  Penicillins; Aspirin; and Lisinopril  Home Medications   Prior to Admission  medications   Medication Sig Start Date End Date Taking? Authorizing Provider  glipiZIDE (GLUCOTROL) 10 MG tablet Take 1 tablet (10 mg total) by mouth daily before breakfast. 04/28/14   Genelle Gather, MD  glucose blood (ACCU-CHEK AVIVA) test strip Use as instructed Patient not taking: Reported on 08/25/2014 04/28/14   Genelle Gather, MD  guaiFENesin (MUCINEX) 600 MG 12 hr tablet Take by mouth 2 (two) times daily.    Historical Provider, MD  hydrochlorothiazide (HYDRODIURIL) 25 MG tablet Take 1 tablet (25 mg total) by mouth daily. 04/28/14   Genelle Gather, MD  insulin glargine (LANTUS) 100 UNIT/ML injection Inject 0.05 mLs (5 Units total) into the skin at bedtime. 08/25/14 08/25/15  Genelle Gather, MD  metFORMIN (GLUCOPHAGE) 1000 MG tablet Take 1 tablet (1,000 mg total) by mouth 2 (two) times daily with a meal. 04/28/14   Genelle Gather, MD  simvastatin (ZOCOR) 20 MG tablet Take 1 tablet (20 mg total) by mouth at bedtime. 04/28/14   Genelle Gather, MD   BP 144/87 mmHg  Pulse 90  Temp(Src) 97.6 F (36.4 C) (Oral)  Resp 16  Ht  (1.676 m)  Wt 180 lb (81.647 kg)  BMI 29.07 kg/m2  SpO2 97%   Physical Exam  Constitutional: He is oriented to person, place, and time. He appears well-developed and well-nourished. No  distress.  HENT:  Head: Normocephalic and atraumatic.  Eyes: EOM and lids are normal. Pupils are equal, round, and reactive to light. Lids are everted and swept, no foreign bodies found. Left eye exhibits no chemosis, no discharge, no exudate and no hordeolum. No foreign body present in the left eye. Left conjunctiva is injected. Left conjunctiva has no hemorrhage. No scleral icterus. Right eye exhibits normal extraocular motion and no nystagmus. Left eye exhibits normal extraocular motion and no nystagmus.  Slit lamp exam:      The left eye shows corneal abrasion and fluorescein uptake. The left eye shows no corneal flare, no corneal ulcer, no foreign body, no hyphema, no hypopyon and  no anterior chamber bulge.  Mild erythema of the bulbar and  palpebral conjunctiva. Punctate area of floor seen uptake noted at 1:00 position of outer border of iris on cornea. EOMs intact. Visual fields intact, no change in visual acuity. No left-sided photophobia or consensual photophobia.  Neck: Neck supple. No tracheal deviation present.  Cardiovascular: Normal rate.   Pulmonary/Chest: Effort normal. No respiratory distress.  Musculoskeletal: Normal range of motion.  Neurological: He is alert and oriented to person, place, and time.  Skin: Skin is warm and dry.  Psychiatric: He has a normal mood and affect. His behavior is normal.  Nursing note and vitals reviewed.   ED Course  Procedures (including critical care time) DIAGNOSTIC STUDIES: Oxygen Saturation is 97% on RA, normal by my interpretation.    COORDINATION OF CARE: 11:56 PM-Discussed treatment plan with pt at bedside and pt agreed to plan.     Labs Review Labs Reviewed - No data to display  Imaging Review No results found.   EKG Interpretation None      MDM   Final diagnoses:  Corneal abrasion, right, initial encounter    Corneal abrasion  Pt with corneal abrasion on PE. Tdap given. Eye irrigated w NS, no evidence of FB.  No change in vision, acuity equal bilaterally. No consensual photophobia..  Pt is not a contact lens wearer.  Exam non-concerning for orbital cellulitis, hyphema, corneal ulcers, iritis. Patient will be discharged home with erythromycin ointment.   Patient understands to follow up with ophthalmology, & to return to ER if new symptoms develop including change in vision, purulent drainage, or entrapment.  I personally performed the services described in this documentation, which was scribed in my presence. The recorded information has been reviewed and is accurate.  BP 144/87 mmHg  Pulse 90  Temp(Src) 97.6 F (36.4 C) (Oral)  Resp 16  Ht 5\' 6"  (1.676 m)  Wt 180 lb (81.647 kg)  BMI 29.07  kg/m2  SpO2 97%  Signed,  Rodney MowJoe Ishmeal Rorie, PA-C 1:31 AM  Patient discussed with Dr. Marisa Severinlga Otter, M.D.     Rodney MowJoe Clydia Nieves, PA-C 09/07/14 0131  Marisa Severinlga Otter, MD 09/07/14 [phone]

## 2014-09-07 MED ORDER — TETANUS-DIPHTH-ACELL PERTUSSIS 5-2.5-18.5 LF-MCG/0.5 IM SUSP
0.5000 mL | Freq: Once | INTRAMUSCULAR | Status: AC
  Administered 2014-09-07: 0.5 mL via INTRAMUSCULAR
  Filled 2014-09-07: qty 0.5

## 2014-09-07 MED ORDER — ERYTHROMYCIN 5 MG/GM OP OINT
1.0000 "application " | TOPICAL_OINTMENT | Freq: Four times a day (QID) | OPHTHALMIC | Status: DC
  Administered 2014-09-07: 1 via OPHTHALMIC
  Filled 2014-09-07: qty 3.5

## 2014-09-07 NOTE — Discharge Instructions (Signed)
Corneal Abrasion °The cornea is the clear covering at the front and center of the eye. When looking at the colored portion of the eye (iris), you are looking through the cornea. This very thin tissue is made up of many layers. The surface layer is a single layer of cells (corneal epithelium) and is one of the most sensitive tissues in the body. If a scratch or injury causes the corneal epithelium to come off, it is called a corneal abrasion. If the injury extends to the tissues below the epithelium, the condition is called a corneal ulcer. °CAUSES  °· Scratches. °· Trauma. °· Foreign body in the eye. °Some people have recurrences of abrasions in the area of the original injury even after it has healed (recurrent erosion syndrome). Recurrent erosion syndrome generally improves and goes away with time. °SYMPTOMS  °· Eye pain. °· Difficulty or inability to keep the injured eye open. °· The eye becomes very sensitive to light. °· Recurrent erosions tend to happen suddenly, first thing in the morning, usually after waking up and opening the eye. °DIAGNOSIS  °Your health care provider can diagnose a corneal abrasion during an eye exam. Dye is usually placed in the eye using a drop or a small paper strip moistened by your tears. When the eye is examined with a special light, the abrasion shows up clearly because of the dye. °TREATMENT  °· Small abrasions may be treated with antibiotic drops or ointment alone. °· A pressure patch may be put over the eye. If this is done, follow your doctor's instructions for when to remove the patch. Do not drive or use machines while the eye patch is on. Judging distances is hard to do with a patch on. °If the abrasion becomes infected and spreads to the deeper tissues of the cornea, a corneal ulcer can result. This is serious because it can cause corneal scarring. Corneal scars interfere with light passing through the cornea and cause a loss of vision in the involved eye. °HOME CARE  INSTRUCTIONS °· Use medicine or ointment as directed. Only take over-the-counter or prescription medicines for pain, discomfort, or fever as directed by your health care provider. °· Do not drive or operate machinery if your eye is patched. Your ability to judge distances is impaired. °· If your health care provider has given you a follow-up appointment, it is very important to keep that appointment. Not keeping the appointment could result in a severe eye infection or permanent loss of vision. If there is any problem keeping the appointment, let your health care provider know. °SEEK MEDICAL CARE IF:  °· You have pain, light sensitivity, and a scratchy feeling in one eye or both eyes. °· Your pressure patch keeps loosening up, and you can blink your eye under the patch after treatment. °· Any kind of discharge develops from the eye after treatment or if the lids stick together in the morning. °· You have the same symptoms in the morning as you did with the original abrasion [place], weeks, or months after the abrasion healed. °MAKE SURE YOU:  °· Understand these instructions. °· Will watch your condition. °· Will get help right away if you are not doing well or get worse. °Document Released: 06/07/2000 Document Revised: 06/15/2013 Document Reviewed: 02/15/2013 °ExitCare® Patient Information ©2015 [employer], LLC. This information is not intended to replace advice given to you by your health care provider. Make sure you discuss any questions you have with your health care provider. ° °

## 2014-09-14 ENCOUNTER — Encounter: Payer: Self-pay | Admitting: Internal Medicine

## 2014-09-20 ENCOUNTER — Encounter: Payer: Self-pay | Admitting: *Deleted

## 2014-09-30 ENCOUNTER — Encounter: Payer: Self-pay | Admitting: Gastroenterology

## 2014-10-06 ENCOUNTER — Telehealth: Payer: Self-pay | Admitting: Dietician

## 2014-10-06 NOTE — Telephone Encounter (Signed)
Call to patient to confirm appointment for 10/10/14 at 1:30 lmtcb

## 2014-10-10 ENCOUNTER — Ambulatory Visit: Payer: 59 | Admitting: Dietician

## 2014-10-24 NOTE — Addendum Note (Signed)
Addended by: Neomia DearPOWERS, Shatasia Cutshaw E on: 10/24/2014 06:32 PM   Modules accepted: Orders

## 2014-10-24 NOTE — Addendum Note (Signed)
Addended by: Neomia DearPOWERS, Meric Joye E on: 10/24/2014 02:01 PM   Modules accepted: Orders

## 2017-01-10 ENCOUNTER — Inpatient Hospital Stay
Admit: 2017-01-10 | Discharge: 2017-01-10 | Disposition: A | Discharge status: Home / Self Care | Payer: Self-pay | Attending: Student in an Organized Health Care Education/Training Program

## 2017-01-10 DIAGNOSIS — I10 Essential (primary) hypertension: Secondary | ICD-10-CM

## 2017-01-10 LAB — METABOLIC PANEL, COMPREHENSIVE
A-G Ratio: 0.8 — ABNORMAL LOW (ref 1.2–3.5)
ALT (SGPT): 25 U/L (ref 12–65)
AST (SGOT): 13 U/L — ABNORMAL LOW (ref 15–37)
Albumin: 3.4 g/dL — ABNORMAL LOW (ref 3.5–5.0)
Alk. phosphatase: 120 U/L (ref 50–136)
Anion gap: 7 mmol/L (ref 7–16)
BUN: 22 MG/DL (ref 6–23)
Bilirubin, total: 0.3 MG/DL (ref 0.2–1.1)
CO2: 29 mmol/L (ref 21–32)
Calcium: 9 MG/DL (ref 8.3–10.4)
Chloride: 99 mmol/L (ref 98–107)
Creatinine: 1.07 MG/DL (ref 0.8–1.5)
GFR est AA: >60 mL/min/{1.73_m2} (ref >60)
GFR est non-AA: >60 mL/min/{1.73_m2} (ref >60)
Globulin: 4.3 g/dL — ABNORMAL HIGH (ref 2.3–3.5)
Glucose: 307 mg/dL — ABNORMAL HIGH (ref 65–100)
Potassium: 4.1 mmol/L (ref 3.5–5.1)
Protein, total: 7.7 g/dL (ref 6.3–8.2)
Sodium: 135 mmol/L — ABNORMAL LOW (ref 136–145)

## 2017-01-10 LAB — CBC WITH AUTOMATED DIFF
ABS. BASOPHILS: 0 10*3/uL (ref 0.0–0.2)
ABS. EOSINOPHILS: 0.3 10*3/uL (ref 0.0–0.8)
ABS. IMM. GRANS.: 0 10*3/uL (ref 0.0–0.5)
ABS. LYMPHOCYTES: 3.1 10*3/uL (ref 0.5–4.6)
ABS. MONOCYTES: 0.3 10*3/uL (ref 0.1–1.3)
ABS. NEUTROPHILS: 2.1 10*3/uL (ref 1.7–8.2)
BASOPHILS: 1 % (ref 0.0–2.0)
EOSINOPHILS: 5 % (ref 0.5–7.8)
HCT: 37.4 % — ABNORMAL LOW (ref 41.1–50.3)
HGB: 11.8 g/dL — ABNORMAL LOW (ref 13.6–17.2)
IMMATURE GRANULOCYTES: 0 % (ref 0.0–5.0)
LYMPHOCYTES: 52 % — ABNORMAL HIGH (ref 13–44)
MCH: 21.7 PG — ABNORMAL LOW (ref 26.1–32.9)
MCHC: 31.6 g/dL (ref 31.4–35.0)
MCV: 68.6 FL — ABNORMAL LOW (ref 79.6–97.8)
MONOCYTES: 6 % (ref 4.0–12.0)
MPV: 9.7 FL — ABNORMAL LOW (ref 10.8–14.1)
NEUTROPHILS: 36 % — ABNORMAL LOW (ref 43–78)
PLATELET: 286 10*3/uL (ref 150–450)
RBC: 5.45 M/uL (ref 4.23–5.67)
RDW: 13.8 % (ref 11.9–14.6)
WBC: 5.9 10*3/uL (ref 4.3–11.1)

## 2017-01-10 LAB — GLUCOSE, POC: Glucose (POC): 304 mg/dL — ABNORMAL HIGH (ref 65–100)

## 2017-01-10 MED ORDER — METFORMIN 500 MG TAB
500 mg | ORAL_TABLET | Freq: Two times a day (BID) | ORAL | 5 refills | Status: AC
Start: 2017-01-10 — End: 2017-02-09

## 2017-01-10 MED ORDER — BLOOD GLUCOSE METER KIT
PACK | 0 refills | Status: AC
Start: 2017-01-10 — End: ?

## 2017-01-10 MED ORDER — HYDROCHLOROTHIAZIDE 25 MG TAB
25 mg | ORAL_TABLET | Freq: Every day | ORAL | 2 refills | Status: AC
Start: 2017-01-10 — End: 2017-02-09

## 2017-01-10 MED ORDER — SODIUM CHLORIDE 0.9% BOLUS IV
0.9 % | Freq: Once | INTRAVENOUS | Status: AC
Start: 2017-01-10 — End: 2017-01-10
  Administered 2017-01-10: 20:00:00 via INTRAVENOUS

## 2017-01-10 MED ORDER — GLYBURIDE 5 MG TAB
5 mg | ORAL_TABLET | Freq: Every day | ORAL | 2 refills | Status: AC
Start: 2017-01-10 — End: ?

## 2017-01-10 MED ORDER — IBUPROFEN 800 MG TAB
800 mg | ORAL | Status: AC
Start: 2017-01-10 — End: 2017-01-10
  Administered 2017-01-10: 21:00:00 via ORAL

## 2017-01-10 MED FILL — IBUPROFEN 800 MG TAB: 800 mg | ORAL | Qty: 1

## 2017-01-10 NOTE — ED Notes (Signed)
Patient's blood glucose 304 during triage

## 2017-01-10 NOTE — ED Notes (Signed)
I have reviewed discharge instructions with the patient.  The patient verbalized understanding.    Patient left ED via Discharge Method: ambulatory to Home with self  Opportunity for questions and clarification provided.       Patient given 4 scripts.         To continue your aftercare when you leave the hospital, you may receive an automated call from our care team to check in on how you are doing.  This is a free service and part of our promise to provide the best care and service to meet your aftercare needs.??? If you have questions, or wish to unsubscribe from this service please call [phone].  Thank you for Choosing our [facility].

## 2017-01-10 NOTE — ED Provider Notes (Addendum)
HPI Comments: 56 year old male patient presents with reports of headache, hyperglycemia and need for medication refill.  Patient states he is new to the area within the past month and a half.  He has been out of his metformin, glyburide and HCTZ for at least 2 months.  He presents today requesting medication refills and describing a dull aching headache that started earlier today.  He denies worst headache of life, thunderclap onset.  He states this is a normal headache for him.  He suspects is related to elevated blood pressure.  She denies any visual change, nausea, vomiting, chest pain, shortness of breath, changes in bowel or bladder habits.  In addition he does report some aching discomfort generally through his abdomen and description of bloated feeling occasionally.  Denies any significant pain with this issue.  He reports normal bowel movements as of late.    Patient is a 56 y.o. male presenting with headaches and hyperglycemia. The history is provided by the patient.   Headache    This is a new problem. The current episode started 6 to 12 hours ago. The problem occurs constantly. The problem has not changed since onset.The headache is aggravated by nothing. The quality of the pain is described as dull. Pertinent negatives include no anorexia, no fever, no malaise/fatigue, no chest pressure, no near-syncope, no orthopnea, no palpitations, no syncope, no shortness of breath, no weakness, no tingling, no dizziness, no visual change, no nausea and no vomiting. He has tried nothing for the symptoms.   High Blood Sugar    Associated symptoms include headaches. Pertinent negatives include no anorexia, no fever, no diarrhea, no nausea, no vomiting, no dysuria, no hematuria, no myalgias, no chest pain and no back pain.        Past Medical History:   Diagnosis Date   ??? Diabetes (HCC)    ??? Hypertension        History reviewed. No pertinent surgical history.      History reviewed. No pertinent family history.     Social History     Social History   ??? Marital status: SINGLE     Spouse name: N/A   ??? Number of children: N/A   ??? Years of education: N/A     Occupational History   ??? Not on file.     Social History Main Topics   ??? Smoking status: Never Smoker   ??? Smokeless tobacco: Never Used   ??? Alcohol use No   ??? Drug use: No   ??? Sexual activity: Not on file     Other Topics Concern   ??? Not on file     Social History Narrative   ??? No narrative on file         ALLERGIES: Aspirin and Penicillin g    Review of Systems   Constitutional: Negative for chills, diaphoresis, fever and malaise/fatigue.   HENT: Negative for congestion, sneezing and sore throat.    Eyes: Negative for visual disturbance.   Respiratory: Negative for cough, chest tightness, shortness of breath and wheezing.    Cardiovascular: Negative for chest pain, palpitations, orthopnea, leg swelling, syncope and near-syncope.   Gastrointestinal: Negative for abdominal pain, anorexia, blood in stool, diarrhea, nausea and vomiting.   Endocrine: Negative for polyuria.   Genitourinary: Negative for difficulty urinating, dysuria, flank pain, hematuria and urgency.   Musculoskeletal: Negative for back pain, myalgias, neck pain and neck stiffness.   Skin: Negative for color change and rash.   Neurological: Positive  for headaches. Negative for dizziness, tingling, syncope, speech difficulty, weakness, light-headedness and numbness.   Psychiatric/Behavioral: Negative for behavioral problems.   All other systems reviewed and are negative.      Vitals:    01/10/17 1556   BP: 145/87   Pulse: (!) 103   Resp: 20   Temp: 98.1 ??F (36.7 ??C)   SpO2: 97%   Weight: 77.1 kg (170 lb)   Height: 5\' 6"  (1.676 m)            Physical Exam   Constitutional: He is oriented to person, place, and time. He appears well-developed and well-nourished. No distress.   Alert and oriented to person, place and time. No acute distress. Speaks in clear, fluent sentences.      HENT:    Head: Normocephalic and atraumatic.   Nose: Nose normal.   Eyes: Conjunctivae and EOM are normal. Pupils are equal, round, and reactive to light.   Neck: Normal range of motion. Neck supple. No JVD present. No tracheal deviation present.   Cardiovascular: Normal rate, regular rhythm, S1 normal, S2 normal, normal heart sounds and intact distal pulses.  Exam reveals no gallop, no distant heart sounds and no friction rub.    No murmur heard.  Pulmonary/Chest: Effort normal and breath sounds normal. No accessory muscle usage or stridor. No tachypnea and no bradypnea. No respiratory distress. He has no decreased breath sounds. He has no wheezes. He has no rhonchi. He has no rales. He exhibits no tenderness.   Abdominal: Soft. Normal appearance. He exhibits no distension and no mass. There is no hepatosplenomegaly, splenomegaly or hepatomegaly. There is no tenderness. There is no rigidity, no rebound, no guarding, no CVA tenderness, no tenderness at McBurney's point and negative Murphy's sign.   Obese, soft and flat to palpation throughout.  No CVA tenderness.  No focal findings on exam.   Musculoskeletal: Normal range of motion. He exhibits no edema, tenderness or deformity.   Neurological: He is alert and oriented to person, place, and time. No cranial nerve deficit.   Skin: Skin is warm and dry. No rash noted. He is not diaphoretic.   Psychiatric: He has a normal mood and affect. His behavior is normal.   Nursing note and vitals reviewed.       MDM  Number of Diagnoses or Management Options  Diagnosis management comments: EKG obtained on arrival shows a normal sinus rhythm with rate of 95 beats a minute.  No evidence of acute ischemia.  Low suspicion for intra-abdominal cause of patient's abdominal complaints.  His blood pressure is minimally elevated and his blood sugar is 304 today.  Patient will receive refills on his medication and referral to local primary care specialist.        Amount and/or Complexity of Data Reviewed  Clinical lab tests: ordered and reviewed  Tests in the radiology section of CPT??: ordered  Tests in the medicine section of CPT??: ordered and reviewed  Independent visualization of images, tracings, or specimens: yes    Risk of Complications, Morbidity, and/or Mortality  Presenting problems: moderate  Diagnostic procedures: low  Management options: moderate    Patient Progress  Patient progress: stable        ED Course       Procedures

## 2017-01-10 NOTE — Progress Notes (Signed)
Return call received from patient. Patient states no PCP and no insurance.  Explained the Access Health program in detail and provided patient with resources. [facility] for [facility] [employer] given to patient and patient encouraged to follow up with her as soon as possible. Patient also given contact information for [facility] with [employer] and encouraged to call to get screened for Medicaid/Insurance eligibility and/or financial assistance.

## 2017-01-10 NOTE — ED Triage Notes (Signed)
Patient c\\o headache, states has been out of bp med and diabetes med for about 2 months.  Patient moved here and has not found a family doctor yet.  Blood sugar during triage 304

## 2017-01-10 NOTE — Progress Notes (Signed)
Referral from Dr. Anise Salvoosebrock from this past weekend to contact the patient regarding little funding and no PCP.  Call to patient at number listed on facesheet and message left to call me back.

## 2017-01-11 LAB — EKG, 12 LEAD, INITIAL
Atrial Rate: 95 {beats}/min
Calculated P Axis: 60 degrees
Calculated R Axis: -8 degrees
Calculated T Axis: 33 degrees
Diagnosis: NORMAL
P-R Interval: 192 ms
Q-T Interval: 352 ms
QRS Duration: 78 ms
QTC Calculation (Bezet): 442 ms
Ventricular Rate: 95 {beats}/min

## 2017-01-11 LAB — EKG 12-LEAD
Atrial Rate: 95 {beats}/min
Diagnosis: NORMAL
P Axis: 60 degrees
P-R Interval: 192 ms
Q-T Interval: 352 ms
QRS Duration: 78 ms
QTc Calculation (Bazett): 442 ms
R Axis: -8 degrees
T Axis: 33 degrees
Ventricular Rate: 95 {beats}/min

## 2018-03-14 ENCOUNTER — Emergency Department (HOSPITAL_COMMUNITY): Payer: Self-pay

## 2018-03-14 ENCOUNTER — Inpatient Hospital Stay (HOSPITAL_COMMUNITY)
Admission: EM | Admit: 2018-03-14 | Discharge: 2018-03-21 | DRG: 064 | Disposition: A | Discharge status: Home / Self Care | Payer: Self-pay | Attending: Family Medicine | Admitting: Family Medicine

## 2018-03-14 ENCOUNTER — Encounter (HOSPITAL_COMMUNITY): Payer: Self-pay | Admitting: *Deleted

## 2018-03-14 DIAGNOSIS — Z886 Allergy status to analgesic agent status: Secondary | ICD-10-CM

## 2018-03-14 DIAGNOSIS — E113299 Type 2 diabetes mellitus with mild nonproliferative diabetic retinopathy without macular edema, unspecified eye: Secondary | ICD-10-CM | POA: Diagnosis present

## 2018-03-14 DIAGNOSIS — M21331 Wrist drop, right wrist: Secondary | ICD-10-CM | POA: Diagnosis present

## 2018-03-14 DIAGNOSIS — E785 Hyperlipidemia, unspecified: Secondary | ICD-10-CM | POA: Diagnosis present

## 2018-03-14 DIAGNOSIS — Z880 Allergy status to penicillin: Secondary | ICD-10-CM

## 2018-03-14 DIAGNOSIS — D509 Iron deficiency anemia, unspecified: Secondary | ICD-10-CM | POA: Diagnosis present

## 2018-03-14 DIAGNOSIS — F17210 Nicotine dependence, cigarettes, uncomplicated: Secondary | ICD-10-CM | POA: Diagnosis present

## 2018-03-14 DIAGNOSIS — I63412 Cerebral infarction due to embolism of left middle cerebral artery: Principal | ICD-10-CM | POA: Diagnosis present

## 2018-03-14 DIAGNOSIS — Z23 Encounter for immunization: Secondary | ICD-10-CM

## 2018-03-14 DIAGNOSIS — Z794 Long term (current) use of insulin: Secondary | ICD-10-CM

## 2018-03-14 DIAGNOSIS — Z888 Allergy status to other drugs, medicaments and biological substances status: Secondary | ICD-10-CM

## 2018-03-14 DIAGNOSIS — I639 Cerebral infarction, unspecified: Secondary | ICD-10-CM

## 2018-03-14 DIAGNOSIS — R29702 NIHSS score 2: Secondary | ICD-10-CM | POA: Diagnosis present

## 2018-03-14 DIAGNOSIS — I10 Essential (primary) hypertension: Secondary | ICD-10-CM | POA: Diagnosis present

## 2018-03-14 DIAGNOSIS — E1165 Type 2 diabetes mellitus with hyperglycemia: Secondary | ICD-10-CM | POA: Diagnosis present

## 2018-03-14 DIAGNOSIS — Z811 Family history of alcohol abuse and dependence: Secondary | ICD-10-CM

## 2018-03-14 DIAGNOSIS — IMO0002 Reserved for concepts with insufficient information to code with codable children: Secondary | ICD-10-CM

## 2018-03-14 DIAGNOSIS — I7771 Dissection of carotid artery: Secondary | ICD-10-CM | POA: Diagnosis present

## 2018-03-14 LAB — RAPID URINE DRUG SCREEN, HOSP PERFORMED
Amphetamines: NOT DETECTED
BENZODIAZEPINES: NOT DETECTED
Barbiturates: NOT DETECTED
COCAINE: NOT DETECTED
OPIATES: NOT DETECTED
Tetrahydrocannabinol: NOT DETECTED

## 2018-03-14 LAB — URINALYSIS, ROUTINE W REFLEX MICROSCOPIC
BILIRUBIN URINE: NEGATIVE
Glucose, UA: >=500 mg/dL — AB
Hgb urine dipstick: NEGATIVE
Ketones, ur: NEGATIVE mg/dL
LEUKOCYTES UA: NEGATIVE
Nitrite: NEGATIVE
PROTEIN: 100 mg/dL — AB
Specific Gravity, Urine: 1.035 — ABNORMAL HIGH (ref 1.005–1.030)
pH: 6 (ref 5.0–8.0)

## 2018-03-14 LAB — COMPREHENSIVE METABOLIC PANEL
ALBUMIN: 3.7 g/dL (ref 3.5–5.0)
ALK PHOS: 96 U/L (ref 38–126)
ALT: 23 U/L (ref 0–44)
AST: 21 U/L (ref 15–41)
Anion gap: 8 (ref 5–15)
BUN: 16 mg/dL (ref 6–20)
CALCIUM: 9.1 mg/dL (ref 8.9–10.3)
CO2: 24 mmol/L (ref 22–32)
CREATININE: 0.9 mg/dL (ref 0.61–1.24)
Chloride: 102 mmol/L (ref 98–111)
GFR calc Af Amer: >60 mL/min (ref >60)
GFR calc non Af Amer: >60 mL/min (ref >60)
GLUCOSE: 328 mg/dL — AB (ref 70–99)
Potassium: 4.7 mmol/L (ref 3.5–5.1)
SODIUM: 134 mmol/L — AB (ref 135–145)
Total Bilirubin: 0.4 mg/dL (ref 0.3–1.2)
Total Protein: 7.7 g/dL (ref 6.5–8.1)

## 2018-03-14 LAB — DIFFERENTIAL
Basophils Absolute: 0 K/uL (ref 0.0–0.1)
Basophils Relative: 0 %
Eosinophils Absolute: 0.3 K/uL (ref 0.0–0.7)
Eosinophils Relative: 4 %
Lymphocytes Relative: 31 %
Lymphs Abs: 2.6 K/uL (ref 0.7–4.0)
Monocytes Absolute: 0.6 K/uL (ref 0.1–1.0)
Monocytes Relative: 7 %
Neutro Abs: 4.9 K/uL (ref 1.7–7.7)
Neutrophils Relative %: 58 %

## 2018-03-14 LAB — ETHANOL: Alcohol, Ethyl (B): <10 mg/dL (ref <10)

## 2018-03-14 LAB — CBC
HCT: 38.8 % — ABNORMAL LOW (ref 39.0–52.0)
Hemoglobin: 12.5 g/dL — ABNORMAL LOW (ref 13.0–17.0)
MCH: 22.9 pg — ABNORMAL LOW (ref 26.0–34.0)
MCHC: 32.2 g/dL (ref 30.0–36.0)
MCV: 70.9 fL — ABNORMAL LOW (ref 78.0–100.0)
Platelets: 339 K/uL (ref 150–400)
RBC: 5.47 MIL/uL (ref 4.22–5.81)
RDW: 13.5 % (ref 11.5–15.5)
WBC: 8.4 K/uL (ref 4.0–10.5)

## 2018-03-14 LAB — I-STAT CHEM 8, ED
BUN: 17 mg/dL (ref 6–20)
CHLORIDE: 101 mmol/L (ref 98–111)
CREATININE: 0.8 mg/dL (ref 0.61–1.24)
Calcium, Ion: 1.21 mmol/L (ref 1.15–1.40)
GLUCOSE: 324 mg/dL — AB (ref 70–99)
HCT: 41 % (ref 39.0–52.0)
HEMOGLOBIN: 13.9 g/dL (ref 13.0–17.0)
POTASSIUM: 4.7 mmol/L (ref 3.5–5.1)
Sodium: 135 mmol/L (ref 135–145)
TCO2: 24 mmol/L (ref 22–32)

## 2018-03-14 LAB — I-STAT TROPONIN, ED: Troponin i, poc: 0.03 ng/mL (ref 0.00–0.08)

## 2018-03-14 LAB — PROTIME-INR
INR: 0.99
Prothrombin Time: 13 seconds (ref 11.4–15.2)

## 2018-03-14 LAB — APTT: APTT: 26 s (ref 24–36)

## 2018-03-14 MED ORDER — IOPAMIDOL (ISOVUE-370) INJECTION 76%
75.0000 mL | Freq: Once | INTRAVENOUS | Status: AC | PRN
  Administered 2018-03-14: 75 mL via INTRAVENOUS

## 2018-03-14 MED ORDER — SODIUM CHLORIDE 0.9 % IV SOLN
INTRAVENOUS | Status: DC
  Administered 2018-03-14: 22:00:00 via INTRAVENOUS

## 2018-03-14 MED ORDER — CLOPIDOGREL BISULFATE 75 MG PO TABS
300.0000 mg | ORAL_TABLET | Freq: Once | ORAL | Status: AC
  Administered 2018-03-14: 300 mg via ORAL
  Filled 2018-03-14: qty 4

## 2018-03-14 MED ORDER — CLOPIDOGREL BISULFATE 75 MG PO TABS
ORAL_TABLET | ORAL | Status: AC
  Filled 2018-03-14: qty 3

## 2018-03-14 NOTE — ED Triage Notes (Signed)
Pt with sudden right arm numbness, no grip as well.  Denies any numbness anywhere else,. Speech clear

## 2018-03-14 NOTE — Progress Notes (Signed)
1855 call time 1855 beeper time 1915 exam started 1918 exam finished 1918 [facility] Dr Clarene DukeMcManus ER physician [phone]

## 2018-03-14 NOTE — Consult Note (Addendum)
[facility] TeleNeurology Consult Services  Date of Service:   03/14/2018 19:19:43  Impression: R/o Acute Ischemic Stroke.  R UE wrist drop and numbness.    Comments: Unclear if central (stroke) or whether could be peripheral (radial nerve) as on exam he mostly exhibits R wrist drop and he woke up from sleep with this deficit.  Metrics: Last Known Well: 03/14/2018 12:00 [facility] Notification Time: 03/14/2018 19:19:06 Arrival Time: 03/14/2018 18:46:00 Stamp Time: 03/14/2018 19:19:43 Time First Login Attempt: 03/14/2018 19:22:57 Video Start Time: 03/14/2018 19:22:57 Video End Time: 03/14/2018 19:39:00  Symptoms: R UE numbness/weakness upon awakening.  NIHSS Start Assessment Time: 03/14/2018 19:25:00 Patient is not a candidate for tPA.  Patient was not deemed candidate for tPA thrombolytics because of Last Well Known Above 4.5 Hours.  Imaging: CT head showed no acute hemorrhage or acute core infarct. Advanced imaging CTA head and neck obtained.  Recommendations: => No tPA. => Recommend STAT CTA to r/o large vessel disease. => Allergy to ASA, can give Plavix. => Needs Brain MRI. => If cerebral ischemia r/o'd will need EMG R UE.   Sign Out: Discussed with ER Provider.  ER physician was notified of the decision on thrombolytics management.   ------------------------------------------------------------------------------  History of Present Illness: Patient was brought by EMS for symptoms of R UE numbness/weakness upon awakening.  57 yo man with h/o smoking, HLD, HTN, DM2, who presents with R UE numbness/weakness. LKW 12:00 when he went to sleep. Woke up at 15:00 and noticed R arm symptoms. He has R wrist drop and endorses numbness more in the dorsal aspect of the forearm/lower arm.  No aphasia, R facial droop, dysarthria, R LE numbness or weakness.  He denies drinking EtOH or using drugs today.  BP 161/105. BG 324.  CT head showed no acute hemorrhage or acute core  infarct.    Examination: 1A: Level of Consciousness - Alert; keenly responsive + 0 1B: Ask Month and Age - Both Questions Right + 0 1C: Blink Eyes & Squeeze Hands - Performs Both Tasks + 0 2: Test Horizontal Extraocular Movements - Normal + 0 3: Test Visual Fields - No Visual Loss + 0 4: Test Facial Palsy (Use Grimace if Obtunded) - Normal symmetry + 0 5A: Test Left Arm Motor Drift - No Drift for 10 Seconds + 0 5B: Test Right Arm Motor Drift - Drift, but doesn't hit bed + 1 6A: Test Left Leg Motor Drift - No Drift for 5 Seconds + 0 6B: Test Right Leg Motor Drift - No Drift for 5 Seconds + 0 7: Test Limb Ataxia (FNF/Heel-Shin) - No Ataxia + 0 8: Test Sensation - Mild-Moderate Loss: Less Sharp/More Dull + 1 9: Test Language/Aphasia - Normal; No aphasia + 0 10: Test Dysarthria - Normal + 0 11: Test Extinction/Inattention - No abnormality + 0  NIHSS Score: 2  Patient was informed the Neurology Consult would happen via TeleHealth consult by way of interactive audio and video telecommunications and consented to receiving care in this manner.  Due to the immediate potential for life-threatening deterioration due to underlying acute neurologic illness, I spent 35 minutes providing critical care. This time includes time for face to face visit via telemedicine, review of medical records, imaging studies and discussion of findings with providers, the patient and/or family.  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 -No intracranial emboli are identified, but  there is subtle hypoattenuation of a LEFT frontal gyrus, which could represent acute infarction. MRI brain recommended for further evaluation. -Nonstenotic intracranial atherosclerotic change of the BILATERAL skull base internal carotid arteries. No  evidence for emergent large vessel occlusion.  Recommendations: =>  ASA allergy.  Load Plavix. =>  Recommend Heparin drip with no bolus given presence of intraluminal thrombus within L ICA dissection flap.  This does convey slight risk of hemorrhagic transformation, but I'm very concerned about risk of further clot propagation with risk for arterial occlusion or further thromboembolic events.  I called and discussed case again with ED physician to give these recommendations.  She had already discussed case with Neurologist on call at transfer facility who had recommend Plavix load and would decide on whether to anticoagulate upon arrival/evaluation.  I will defer to the transfer facility neurologist on site.  Precious HawsMichael D Tavio Biegel

## 2018-03-14 NOTE — ED Provider Notes (Signed)
[facility] EMERGENCY DEPARTMENT Provider Note   CSN: [id number] Arrival date & time: 03/14/18  1846     History   Chief Complaint Chief Complaint  Patient presents with  . Numbness    right arm    HPI Rodney Mcgee is a 57 y.o. male.  The history is provided by the patient. The history is limited by the condition of the patient (Acuity of condition).    Pt was seen at 1900. Per pt: States he woke up from a nap, began to walk around his house, and noticed that his RUE "got numb" approximately 1600 PTA. Pt states he is unable to hold/grip anything with his right hand. Pt is right handed. Denies injury, no neck pain, no headache, no visual changes, no other area of focal motor weakness, no tingling/numbness in other extremities, no ataxia, no slurred speech, no facial droop, no CP/palpitations, no SOB/cough, no abd pain, no N/V/D.    Past Medical History:  Diagnosis Date  . Diabetes mellitus without complication (HCC)   . Hyperlipidemia   . Hypertension   . Tobacco abuse     Patient Active Problem List   Diagnosis Date Noted  . Preventative health care 03/13/2012  . Erectile dysfunction associated with type 2 diabetes mellitus (HCC) 10/01/2010  . Allergic rhinitis, seasonal 10/01/2010  . CRACKED TOOTH 12/07/2008  . Nonproliferative diabetic retinopathy WUJ(811.91) 07/22/2007  . Hyperlipidemia LDL goal <100 12/31/2006  . PAIN IN JOINT, SHOULDER 07/22/2006  . History of tobacco abuse 06/16/2006  . Essential hypertension 06/03/2006  . Type 2 diabetes mellitus, uncontrolled (HCC) 06/03/1994    History reviewed. No pertinent surgical history.      Home Medications    Prior to Admission medications   Medication Sig Start Date End Date Taking? Authorizing Provider  glipiZIDE (GLUCOTROL) 10 MG tablet Take 1 tablet (10 mg total) by mouth daily before breakfast. 04/28/14   Genelle Gather, MD  glucose blood (ACCU-CHEK AVIVA) test strip Use as instructed Patient not  taking: Reported on 08/25/2014 04/28/14   Genelle Gather, MD  guaiFENesin (MUCINEX) 600 MG 12 hr tablet Take by mouth 2 (two) times daily.    [provider]  hydrochlorothiazide (HYDRODIURIL) 25 MG tablet Take 1 tablet (25 mg total) by mouth daily. 04/28/14   Genelle Gather, MD  insulin glargine (LANTUS) 100 UNIT/ML injection Inject 0.05 mLs (5 Units total) into the skin at bedtime. 08/25/14 08/25/15  Genelle Gather, MD  metFORMIN (GLUCOPHAGE) 1000 MG tablet Take 1 tablet (1,000 mg total) by mouth 2 (two) times daily with a meal. 04/28/14   Genelle Gather, MD  simvastatin (ZOCOR) 20 MG tablet Take 1 tablet (20 mg total) by mouth at bedtime. 04/28/14   Genelle Gather, MD    Family History Family History  Problem Relation Age of Onset  . Alcohol abuse Father     Social History Social History   Tobacco Use  . Smoking status: Former Smoker    Packs/day: 0.20    Types: Cigarettes    Last attempt to quit: 06/24/2013    Years since quitting: 4.7  . Smokeless tobacco: Never Used  . Tobacco comment: QUIT line info given  Substance Use Topics  . Alcohol use: No  . Drug use: No     Allergies   Penicillins; Aspirin; and Lisinopril   Review of Systems Review of Systems  Unable to perform ROS: Acuity of condition      Physical Exam Updated Vital  Signs BP (!) 161/92   Pulse 93   Temp 98 F (36.7 C) (Oral)   Ht 5\' 6"  (1.676 m)   Wt 74.8 kg   SpO2 98%   BMI 26.63 kg/m    Patient Vitals for the past 24 hrs:  BP Temp Temp src Pulse Resp SpO2 Height Weight  03/14/18 2030 (!) 165/90 - - 91 (!) 23 98 % - -  03/14/18 2015 (!) 152/88 - - 91 17 98 % - -  03/14/18 2000 (!) 149/87 - - 91 20 98 % - -  03/14/18 1945 (!) 158/128 - - 91 19 99 % - -  03/14/18 1930 (!) 161/105 - - (!) 101 16 97 % - -  03/14/18 1900 (!) 144/75 - - 100 (!) 22 99 % - -  03/14/18 1857 - 98 F (36.7 C) Oral - - - - -  03/14/18 1856 - - - 93 - 98 % - -  03/14/18 1855 - - - 91 - 98 % - -  03/14/18  1854 - - - 95 - 99 % - -  03/14/18 1853 - - - 99 - 99 % 5\' 6"  (1.676 m) 74.8 kg  03/14/18 1851 (!) 161/92 - - - - - - -     Physical Exam 1905: Physical examination:  Nursing notes reviewed; Vital signs and O2 SAT reviewed;  Constitutional: Well developed, Well nourished, Well hydrated, In no acute distress; Head:  Normocephalic, atraumatic; Eyes: EOMI, PERRL, No scleral icterus; ENMT: Mouth and pharynx normal, Mucous membranes moist; Neck: Supple, Full range of motion, No lymphadenopathy; Cardiovascular: Regular rate and rhythm, No gallop; Respiratory: Breath sounds clear & equal bilaterally, No wheezes.  Speaking full sentences with ease, Normal respiratory effort/excursion; Chest: Nontender, Movement normal; Abdomen: Soft, Nontender, Nondistended, Normal bowel sounds; Genitourinary: No CVA tenderness; Extremities: Peripheral pulses normal, No tenderness, No edema, No calf edema or asymmetry.; Neuro: AA&Ox3, Major CN grossly intact. Speech clear.  No facial droop. Strong left grip. Strength 5/5 equal bilat LE's and LUE. Strength 3-4/5 RUE, with minimal grip strength. +subjective decreased sensation RUE, otherwise no gross sensory deficits.  Normal cerebellar testing bilat UE's (finger-nose) and LE's (heel-shin).; Skin: Color normal, Warm, Dry.   ED Treatments / Results  Labs (all labs ordered are listed, but only abnormal results are displayed)   EKG EKG Interpretation  Date/Time:  Saturday March 14 2018 19:02:20 EDT Ventricular Rate:  97 PR Interval:    QRS Duration: 82 QT Interval:  325 QTC Calculation: 413 R Axis:   95 Text Interpretation:  Sinus rhythm Borderline right axis deviation Borderline T wave abnormalities Baseline wander Artifact When compared with ECG of 08/20/2013 Rate slower Confirmed by Samuel JesterMcManus, Keontre Defino [phone]) on 03/14/2018 7:10:16 PM   Radiology   Procedures Procedures (including critical care time)  Medications Ordered in ED Medications - No data to  display   Initial Impression / Assessment and Plan / ED Course  I have reviewed the triage vital signs and the nursing notes.  Pertinent labs & imaging results that were available during my care of the patient were reviewed by me and considered in my medical decision making (see chart for details).  MDM Reviewed: previous chart, nursing note and vitals Reviewed previous: labs and ECG Interpretation: labs, ECG and CT scan Total time providing critical care: 30-74 minutes. This excludes time spent performing separately reportable procedures and services. Consults: neurology and admitting MD   CRITICAL CARE Performed by: Samuel JesterKathleen Zackrey Dyar Total critical care time:  45 minutes Critical care time was exclusive of separately billable procedures and treating other patients. Critical care was necessary to treat or prevent imminent or life-threatening deterioration. Critical care was time spent personally by me on the following activities: development of treatment plan with patient and/or surrogate as well as nursing, discussions with consultants, evaluation of patient's response to treatment, examination of patient, obtaining history from patient or surrogate, ordering and performing treatments and interventions, ordering and review of laboratory studies, ordering and review of radiographic studies, pulse oximetry and re-evaluation of patient's condition.  Results for orders placed or performed during the hospital encounter of 03/14/18  Ethanol  Result Value Ref Range   Alcohol, Ethyl (B) <10 <10 mg/dL  Protime-INR  Result Value Ref Range   Prothrombin Time 13.0 11.4 - 15.2 seconds   INR 0.99   APTT  Result Value Ref Range   aPTT 26 24 - 36 seconds  CBC  Result Value Ref Range   WBC 8.4 4.0 - 10.5 K/uL   RBC 5.47 4.22 - 5.81 MIL/uL   Hemoglobin 12.5 (L) 13.0 - 17.0 g/dL   HCT 29.5 (L) 62.1 - 30.8 %   MCV 70.9 (L) 78.0 - 100.0 fL   MCH 22.9 (L) 26.0 - 34.0 pg   MCHC 32.2 30.0 - 36.0  g/dL   RDW 65.7 84.6 - 96.2 %   Platelets 339 150 - 400 K/uL  Differential  Result Value Ref Range   Neutrophils Relative % 58 %   Neutro Abs 4.9 1.7 - 7.7 K/uL   Lymphocytes Relative 31 %   Lymphs Abs 2.6 0.7 - 4.0 K/uL   Monocytes Relative 7 %   Monocytes Absolute 0.6 0.1 - 1.0 K/uL   Eosinophils Relative 4 %   Eosinophils Absolute 0.3 0.0 - 0.7 K/uL   Basophils Relative 0 %   Basophils Absolute 0.0 0.0 - 0.1 K/uL  Comprehensive metabolic panel  Result Value Ref Range   Sodium 134 (L) 135 - 145 mmol/L   Potassium 4.7 3.5 - 5.1 mmol/L   Chloride 102 98 - 111 mmol/L   CO2 24 22 - 32 mmol/L   Glucose, Bld 328 (H) 70 - 99 mg/dL   BUN 16 6 - 20 mg/dL   Creatinine, Ser 9.52 0.61 - 1.24 mg/dL   Calcium 9.1 8.9 - 84.1 mg/dL   Total Protein 7.7 6.5 - 8.1 g/dL   Albumin 3.7 3.5 - 5.0 g/dL   AST 21 15 - 41 U/L   ALT 23 0 - 44 U/L   Alkaline Phosphatase 96 38 - 126 U/L   Total Bilirubin 0.4 0.3 - 1.2 mg/dL   GFR calc non Af Amer >60 >60 mL/min   GFR calc Af Amer >60 >60 mL/min   Anion gap 8 5 - 15  Urine rapid drug screen (hosp performed)not at [facility]  Result Value Ref Range   Opiates NONE DETECTED NONE DETECTED   Cocaine NONE DETECTED NONE DETECTED   Benzodiazepines NONE DETECTED NONE DETECTED   Amphetamines NONE DETECTED NONE DETECTED   Tetrahydrocannabinol NONE DETECTED NONE DETECTED   Barbiturates NONE DETECTED NONE DETECTED  Urinalysis, Routine w reflex microscopic  Result Value Ref Range   Color, Urine YELLOW YELLOW   APPearance CLEAR CLEAR   Specific Gravity, Urine 1.035 (H) 1.005 - 1.030   pH 6.0 5.0 - 8.0   Glucose, UA >=500 (A) NEGATIVE mg/dL   Hgb urine dipstick NEGATIVE NEGATIVE   Bilirubin Urine NEGATIVE NEGATIVE   Ketones, ur  NEGATIVE NEGATIVE mg/dL   Protein, ur 578 (A) NEGATIVE mg/dL   Nitrite NEGATIVE NEGATIVE   Leukocytes, UA NEGATIVE NEGATIVE   RBC / HPF 0-5 0 - 5 RBC/hpf   WBC, UA 0-5 0 - 5 WBC/hpf   Bacteria, UA RARE (A) NONE SEEN   Mucus PRESENT     Hyaline Casts, UA PRESENT   I-Stat Chem 8, ED  (not at [facility], [facility])  Result Value Ref Range   Sodium 135 135 - 145 mmol/L   Potassium 4.7 3.5 - 5.1 mmol/L   Chloride 101 98 - 111 mmol/L   BUN 17 6 - 20 mg/dL   Creatinine, Ser 4.69 0.61 - 1.24 mg/dL   Glucose, Bld 629 (H) 70 - 99 mg/dL   Calcium, Ion 5.28 4.13 - 1.40 mmol/L   TCO2 24 22 - 32 mmol/L   Hemoglobin 13.9 13.0 - 17.0 g/dL   HCT 24.4 01.0 - 27.2 %  I-stat troponin, ED (not at [facility], [facility])  Result Value Ref Range   Troponin i, poc 0.03 0.00 - 0.08 ng/mL   Comment 3           Ct Angio Head W Or Wo Contrast Result Date: 03/14/2018 CLINICAL DATA:  RIGHT upper extremity weakness and numbness. Onset earlier today. EXAM: CT ANGIOGRAPHY HEAD AND NECK TECHNIQUE: Multidetector CT imaging of the head and neck was performed using the standard protocol during bolus administration of intravenous contrast. Multiplanar CT image reconstructions and MIPs were obtained to evaluate the vascular anatomy. Carotid stenosis measurements (when applicable) are obtained utilizing NASCET criteria, using the distal internal carotid diameter as the denominator. CONTRAST:  75mL ISOVUE-370 IOPAMIDOL (ISOVUE-370) INJECTION 76% COMPARISON:  Noncontrast CT head at 16:27 hours FINDINGS: CTA NECK FINDINGS Aortic arch: Bovine trunk. Imaged portion shows no evidence of aneurysm or dissection. No significant stenosis of the major arch vessel origins. Right carotid system: No evidence of dissection, stenosis (50% or greater) or occlusion. Left carotid system: There is a long segment of dissection involving the mid common carotid artery on the LEFT with intraluminal thrombus. The dissection extends over 4 cm, and appears to terminate 2 cm below the bifurcation. Estimated 75% stenosis based on luminal measurements of 1.2/4.7 mm. Carotid bifurcation shows only minor atheromatous change. No cervical ICA disease. Vertebral arteries: LEFT vertebral dominant. RIGHT vertebral diminutive  with atheromatous change at its origin. No ostial stenosis on the LEFT. Skeleton: Spondylosis.  No worrisome osseous lesion. Other neck: No neck masses. Upper chest: No pneumothorax or mass. Review of the MIP images confirms the above findings CTA HEAD FINDINGS Anterior circulation: Nonstenotic atheromatous change of the petrous and cavernous ICA segments with calcifications. ICA termini widely patent. No M1 or A1 stenosis of significance. No M2 or M3 branch irregularity. Posterior circulation: No significant stenosis, proximal occlusion, aneurysm, or vascular malformation. Venous sinuses: As permitted by contrast timing, patent. Anatomic variants: None of significance.  Fetal LEFT PCA. Delayed phase: There could be developing hypodensity of a LEFT frontal gyrus near the convexity, see image 26 series 12; this was not clearly present on earlier CT. No abnormal enhancement is observed. 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 No intracranial emboli are identified, but there is subtle hypoattenuation of a LEFT frontal gyrus, which could represent acute infarction. MRI brain recommended for further evaluation. Nonstenotic intracranial atherosclerotic change of the BILATERAL skull base internal carotid  arteries. No evidence for emergent large vessel occlusion. These results were called by telephone at the time of interpretation on 03/14/2018 at 9:44 pm to Dr. Clarene Duke, Who verbally acknowledged these results. Electronically Signed   By: Elsie Stain M.D.   On: 03/14/2018 21:52   Ct Angio Neck W Or Wo Contrast Result Date: 03/14/2018 CLINICAL DATA:  RIGHT upper extremity weakness and numbness. Onset earlier today. EXAM: CT ANGIOGRAPHY HEAD AND NECK TECHNIQUE: Multidetector CT imaging of the head and neck was performed using the standard protocol during bolus  administration of intravenous contrast. Multiplanar CT image reconstructions and MIPs were obtained to evaluate the vascular anatomy. Carotid stenosis measurements (when applicable) are obtained utilizing NASCET criteria, using the distal internal carotid diameter as the denominator. CONTRAST:  75mL ISOVUE-370 IOPAMIDOL (ISOVUE-370) INJECTION 76% COMPARISON:  Noncontrast CT head at 16:27 hours FINDINGS: CTA NECK FINDINGS Aortic arch: Bovine trunk. Imaged portion shows no evidence of aneurysm or dissection. No significant stenosis of the major arch vessel origins. Right carotid system: No evidence of dissection, stenosis (50% or greater) or occlusion. Left carotid system: There is a long segment of dissection involving the mid common carotid artery on the LEFT with intraluminal thrombus. The dissection extends over 4 cm, and appears to terminate 2 cm below the bifurcation. Estimated 75% stenosis based on luminal measurements of 1.2/4.7 mm. Carotid bifurcation shows only minor atheromatous change. No cervical ICA disease. Vertebral arteries: LEFT vertebral dominant. RIGHT vertebral diminutive with atheromatous change at its origin. No ostial stenosis on the LEFT. Skeleton: Spondylosis.  No worrisome osseous lesion. Other neck: No neck masses. Upper chest: No pneumothorax or mass. Review of the MIP images confirms the above findings CTA HEAD FINDINGS Anterior circulation: Nonstenotic atheromatous change of the petrous and cavernous ICA segments with calcifications. ICA termini widely patent. No M1 or A1 stenosis of significance. No M2 or M3 branch irregularity. Posterior circulation: No significant stenosis, proximal occlusion, aneurysm, or vascular malformation. Venous sinuses: As permitted by contrast timing, patent. Anatomic variants: None of significance.  Fetal LEFT PCA. Delayed phase: There could be developing hypodensity of a LEFT frontal gyrus near the convexity, see image 26 series 12; this was not clearly  present on earlier CT. No abnormal enhancement is observed. 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 No intracranial emboli are identified, but there is subtle hypoattenuation of a LEFT frontal gyrus, which could represent acute infarction. MRI brain recommended for further evaluation. Nonstenotic intracranial atherosclerotic change of the BILATERAL skull base internal carotid arteries. No evidence for emergent large vessel occlusion. These results were called by telephone at the time of interpretation on 03/14/2018 at 9:44 pm to Dr. Clarene Duke, Who verbally acknowledged these results. Electronically Signed   By: Elsie Stain M.D.   On: 03/14/2018 21:52   Ct Head Code Stroke Wo Contrast Result Date: 03/14/2018 CLINICAL DATA:  Code stroke. RIGHT-sided numbness for 3 hours. RIGHT upper extremity weakness. EXAM: CT HEAD WITHOUT CONTRAST TECHNIQUE: Contiguous axial images were obtained from the base of the skull through the vertex without intravenous contrast. COMPARISON:  None. FINDINGS: Brain: No evidence for acute infarction, hemorrhage, mass lesion, hydrocephalus, or extra-axial fluid. Mild premature cerebral and cerebellar atrophy, with ventricular prominence particularly of the fourth ventricle reflecting vermian brain substance loss. Vascular: No hyperdense vessel or unexpected calcification. Skull: Normal. Negative for fracture or focal lesion. Scalp sebaceous cysts. Sinuses/Orbits:  Significant opacity of the RIGHT maxillary sinus. Small retention cyst in the LEFT maxillary sinus. Frontal sinuses hypoplastic. Ethmoid sphenoid sinuses unremarkable. Dense lenticular opacities. Other: None. ASPECTS [facility] Stroke Program Early CT Score) - Ganglionic level infarction (caudate, lentiform nuclei, internal capsule, insula, M1-M3 cortex): 7 - Supraganglionic  infarction (M4-M6 cortex): 3 Total score (0-10 with 10 being normal): 10 IMPRESSION: 1. Atrophy.  No evidence of acute stroke. 2. ASPECTS is 10. These results were called by telephone at the time of interpretation on 03/14/2018 at 7:36 pm to Dr. Samuel Jester , who verbally acknowledged these results. Electronically Signed   By: Elsie Stain M.D.   On: 03/14/2018 19:42    1935: Code Stroke called on arrival. Tele Neuro Dr. Ilsa Iha has evaluated pt: states no TPA, as pt told him he woke up with symptoms, pt endorses allergy to ASA, please obtain CT-A head and neck, admit for MRI/further workup.  2205:  T/C returned from [facility] Neuro Dr. Amada Jupiter, case discussed, including:  HPI, pertinent PM/SHx, VS/PE, dx testing, ED course and treatment:  He has viewed the CT images, states no acute surgical/interventional issue at this time, given pt's allergy to ASA, dose Plavix 300mg  PO now, admit to [facility] service to [facility] 3W.  2225:  T/C returned from [facility] Dr. Sharl Ma, case discussed, including:  HPI, pertinent PM/SHx, VS/PE, dx testing, ED course and treatment:  Agreeable to admit/transfer to [facility].      Final Clinical Impressions(s) / ED Diagnoses   Final diagnoses:  None    ED Discharge Orders    None       Samuel Jester, DO 03/18/18 1543

## 2018-03-15 ENCOUNTER — Other Ambulatory Visit: Payer: Self-pay

## 2018-03-15 ENCOUNTER — Inpatient Hospital Stay (HOSPITAL_COMMUNITY): Payer: Self-pay

## 2018-03-15 DIAGNOSIS — E7800 Pure hypercholesterolemia, unspecified: Secondary | ICD-10-CM

## 2018-03-15 DIAGNOSIS — I6522 Occlusion and stenosis of left carotid artery: Secondary | ICD-10-CM

## 2018-03-15 DIAGNOSIS — I639 Cerebral infarction, unspecified: Secondary | ICD-10-CM

## 2018-03-15 DIAGNOSIS — E1165 Type 2 diabetes mellitus with hyperglycemia: Secondary | ICD-10-CM

## 2018-03-15 DIAGNOSIS — I63239 Cerebral infarction due to unspecified occlusion or stenosis of unspecified carotid arteries: Secondary | ICD-10-CM

## 2018-03-15 DIAGNOSIS — I7771 Dissection of carotid artery: Secondary | ICD-10-CM

## 2018-03-15 DIAGNOSIS — I63232 Cerebral infarction due to unspecified occlusion or stenosis of left carotid arteries: Secondary | ICD-10-CM

## 2018-03-15 DIAGNOSIS — I10 Essential (primary) hypertension: Secondary | ICD-10-CM

## 2018-03-15 DIAGNOSIS — Z87891 Personal history of nicotine dependence: Secondary | ICD-10-CM

## 2018-03-15 LAB — LIPID PANEL
CHOLESTEROL: 202 mg/dL — AB (ref 0–200)
HDL: 29 mg/dL — ABNORMAL LOW (ref >40)
LDL CALC: 150 mg/dL — AB (ref 0–99)
TRIGLYCERIDES: 117 mg/dL (ref <150)
Total CHOL/HDL Ratio: 7 RATIO
VLDL: 23 mg/dL (ref 0–40)

## 2018-03-15 LAB — CBC
HEMATOCRIT: 40.4 % (ref 39.0–52.0)
Hemoglobin: 12.1 g/dL — ABNORMAL LOW (ref 13.0–17.0)
MCH: 21.6 pg — ABNORMAL LOW (ref 26.0–34.0)
MCHC: 30 g/dL (ref 30.0–36.0)
MCV: 72.3 fL — AB (ref 78.0–100.0)
Platelets: 332 10*3/uL (ref 150–400)
RBC: 5.59 MIL/uL (ref 4.22–5.81)
RDW: 13.5 % (ref 11.5–15.5)
WBC: 7.8 10*3/uL (ref 4.0–10.5)

## 2018-03-15 LAB — CREATININE, SERUM: Creatinine, Ser: 0.73 mg/dL (ref 0.61–1.24)

## 2018-03-15 LAB — HEMOGLOBIN A1C
HEMOGLOBIN A1C: 10.9 % — AB (ref 4.8–5.6)
Mean Plasma Glucose: 266.13 mg/dL

## 2018-03-15 LAB — GLUCOSE, CAPILLARY
GLUCOSE-CAPILLARY: 231 mg/dL — AB (ref 70–99)
GLUCOSE-CAPILLARY: 214 mg/dL — AB (ref 70–99)
Glucose-Capillary: 158 mg/dL — ABNORMAL HIGH (ref 70–99)
Glucose-Capillary: 267 mg/dL — ABNORMAL HIGH (ref 70–99)

## 2018-03-15 LAB — CBG MONITORING, ED: Glucose-Capillary: 219 mg/dL — ABNORMAL HIGH (ref 70–99)

## 2018-03-15 LAB — HIV ANTIBODY (ROUTINE TESTING W REFLEX): HIV SCREEN 4TH GENERATION: NONREACTIVE

## 2018-03-15 MED ORDER — ACETAMINOPHEN 325 MG PO TABS: 650.0000 mg | ORAL_TABLET | ORAL | Status: DC | PRN

## 2018-03-15 MED ORDER — INSULIN ASPART 100 UNIT/ML ~~LOC~~ SOLN
0.0000 [IU] | Freq: Three times a day (TID) | SUBCUTANEOUS | Status: DC
  Administered 2018-03-15: 5 [IU] via SUBCUTANEOUS
  Administered 2018-03-16: 11 [IU] via SUBCUTANEOUS
  Administered 2018-03-16 (×2): 3 [IU] via SUBCUTANEOUS
  Administered 2018-03-17 (×2): 5 [IU] via SUBCUTANEOUS

## 2018-03-15 MED ORDER — CLOPIDOGREL BISULFATE 75 MG PO TABS
75.0000 mg | ORAL_TABLET | Freq: Every day | ORAL | Status: DC
  Administered 2018-03-15: 75 mg via ORAL
  Filled 2018-03-15: qty 1

## 2018-03-15 MED ORDER — HEPARIN (PORCINE) IN NACL 100-0.45 UNIT/ML-% IJ SOLN
1150.0000 [IU]/h | INTRAMUSCULAR | Status: DC
  Administered 2018-03-15: 1050 [IU]/h via INTRAVENOUS
  Filled 2018-03-15 (×2): qty 250

## 2018-03-15 MED ORDER — STROKE: EARLY STAGES OF RECOVERY BOOK
Freq: Once | Status: AC
  Administered 2018-03-15: 06:00:00

## 2018-03-15 MED ORDER — SODIUM CHLORIDE 0.9 % IV SOLN
INTRAVENOUS | Status: DC
  Administered 2018-03-15: 06:00:00 via INTRAVENOUS

## 2018-03-15 MED ORDER — PNEUMOCOCCAL VAC POLYVALENT 25 MCG/0.5ML IJ INJ
0.5000 mL | INJECTION | INTRAMUSCULAR | Status: AC
  Administered 2018-03-17: 0.5 mL via INTRAMUSCULAR
  Filled 2018-03-15 (×2): qty 0.5

## 2018-03-15 MED ORDER — INSULIN GLARGINE 100 UNIT/ML ~~LOC~~ SOLN
10.0000 [IU] | Freq: Every day | SUBCUTANEOUS | Status: DC
  Administered 2018-03-15 – 2018-03-17 (×3): 10 [IU] via SUBCUTANEOUS
  Filled 2018-03-15 (×3): qty 0.1

## 2018-03-15 MED ORDER — ATORVASTATIN CALCIUM 80 MG PO TABS
80.0000 mg | ORAL_TABLET | Freq: Every day | ORAL | Status: DC
  Administered 2018-03-15 – 2018-03-20 (×6): 80 mg via ORAL
  Filled 2018-03-15 (×6): qty 1

## 2018-03-15 MED ORDER — INSULIN ASPART 100 UNIT/ML ~~LOC~~ SOLN
0.0000 [IU] | Freq: Four times a day (QID) | SUBCUTANEOUS | Status: DC
  Administered 2018-03-15: 5 [IU] via SUBCUTANEOUS
  Administered 2018-03-15: 3 [IU] via SUBCUTANEOUS

## 2018-03-15 MED ORDER — ACETAMINOPHEN 650 MG RE SUPP: 650.0000 mg | RECTAL | Status: DC | PRN

## 2018-03-15 MED ORDER — ENOXAPARIN SODIUM 40 MG/0.4ML ~~LOC~~ SOLN
40.0000 mg | SUBCUTANEOUS | Status: DC
  Administered 2018-03-15: 40 mg via SUBCUTANEOUS
  Filled 2018-03-15: qty 0.4

## 2018-03-15 MED ORDER — INSULIN ASPART 100 UNIT/ML ~~LOC~~ SOLN
0.0000 [IU] | Freq: Every day | SUBCUTANEOUS | Status: DC
  Administered 2018-03-15: 3 [IU] via SUBCUTANEOUS
  Administered 2018-03-16: 2 [IU] via SUBCUTANEOUS

## 2018-03-15 MED ORDER — ACETAMINOPHEN 160 MG/5ML PO SOLN: 650.0000 mg | ORAL | Status: DC | PRN

## 2018-03-15 NOTE — Progress Notes (Signed)
PROGRESS NOTE   Rodney Mcgee  ZOX:[id number]    DOB: 1960/08/31    DOA: 03/14/2018  PCP: Patient, No Pcp Per   I have briefly reviewed patients previous medical records in [facility].  Brief Narrative:  57 year old male, resident of [place] who was visiting family in [place], [place], works in the Psychologist, forensic at [employer], PMH of DM 2, HTN, HLD, tobacco abuse presented initially to [facility] with sudden onset of right upper extremity numbness and weakness without any other stroke like symptoms that started around 4:30 PM on 9/21, code stroke initiated, evaluated by telemetry neurology, no TPA administered, CT of head and neck showed long segment left CCA dissection with intraluminal thrombus and possible left frontal gyrus acute infarction, Neurology at [facility] was consulted and patient transferred for further evaluation and management. Stroke team consulted.   Assessment & Plan:   Active Problems:   Stroke (cerebrum) (HCC)   Suspected acute left brain stroke: Resultant right upper extremity weakness.  Etiology: Embolic from left CCA dissection.  CT head 9/21: No evidence of acute stroke.  CTA head 9/21: Subtle hypoattenuation of the left frontal gyrus which could represent acute stroke.  CTA neck: Long segment left CCA dissection, extending over approximately 4 cm, with intraluminal thrombus, estimated 75% stenosis.  LDL 150.  Follow MRI brain, 2D echo, A1c.  Therapies recommend outpatient OT.  Neurology/stroke service consulted, received Plavix 300 mg at [facility].  As per discussion with Dr. Roda Shutters, Stroke MD, initiating IV heparin infusion and if stable over the next 24 to 48 hours, need to start warfarin and plan 3 months anticoagulation followed by repeat CTA neck.  Continue atorvastatin 80 mg daily.?  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  As per neurology, 3 months anticoagulation.  IV heparin bridge and  Coumadin.  Uncontrolled DM 2: A1c in 2016 > 14 suggesting very poor control.  Oral hypoglycemics held.  Poorly controlled on SSI alone.  Start Lantus 10 units daily.  Based on A1c, may need to consider insulins at discharge.  Essential hypertension: Allowing permissive hypertension due to acute stroke.  Not on antihypertensives PTA.  Hyperlipidemia: LDL 150.  Atorvastatin 80 mg daily.  Microcytosis: Unclear etiology.  Outpatient follow-up.   DVT prophylaxis: Currently on Lovenox, stroke MD plans to initiate IV heparin infusion. Code Status: Full Family Communication: None at bed Disposition: DC home pending clinical improvement and further evaluation and management.   Consultants:  Neurology  Procedures:  None  Antimicrobials:  None   Subjective: Feels better.  Right upper extremity strength is improved but not at baseline.  No slurred speech, facial droop or right lower extremity weakness.  ROS: As above, otherwise negative.  Objective:  Vitals:   03/15/18 0624 03/15/18 0705 03/15/18 0706 03/15/18 1100  BP: (!) 166/88 (!) 162/96 (!) 162/96 (!) 127/48  Pulse: 91 89 86 91  Resp:   18 18  Temp:   97.8 F (36.6 C) 97.6 F (36.4 C)  TempSrc:   Oral Oral  SpO2: 97% 99% 98% 98%  Weight:      Height:        Examination:  General exam: Pleasant young male, moderately built and nourished, seen ambulating comfortably with PT. Respiratory system: Clear to auscultation. Respiratory effort normal. Cardiovascular system: S1 & S2 heard, RRR. No JVD, murmurs, rubs, gallops or clicks. No pedal edema.  Telemetry personally reviewed: Sinus rhythm. Gastrointestinal system: Abdomen is nondistended, soft  and nontender. No organomegaly or masses felt. Normal bowel sounds heard. Central nervous system: Alert and oriented. No cranial nerve deficits.  Right upper extremity grade 4 x 5 power with pronator drift. Neck: No carotid bruit appreciated. Extremities: Symmetric 5 x 5  power. Skin: No rashes, lesions or ulcers Psychiatry: Judgement and insight appear normal. Mood & affect appropriate.     Data Reviewed: I have personally reviewed following labs and imaging studies  CBC: Recent Labs  Lab 03/14/18 1912 03/14/18 1921 03/15/18 0428  WBC 8.4  --  7.8  NEUTROABS 4.9  --   --   HGB 12.5* 13.9 12.1*  HCT 38.8* 41.0 40.4  MCV 70.9*  --  72.3*  PLT 339  --  332   Basic Metabolic Panel: Recent Labs  Lab 03/14/18 1912 03/14/18 1921 03/15/18 0428  NA 134* 135  --   K 4.7 4.7  --   CL 102 101  --   CO2 24  --   --   GLUCOSE 328* 324*  --   BUN 16 17  --   CREATININE 0.90 0.80 0.73  CALCIUM 9.1  --   --    Liver Function Tests: Recent Labs  Lab 03/14/18 1912  AST 21  ALT 23  ALKPHOS 96  BILITOT 0.4  PROT 7.7  ALBUMIN 3.7   Coagulation Profile: Recent Labs  Lab 03/14/18 1912  INR 0.99   CBG: Recent Labs  Lab 03/15/18 0158 03/15/18 0619 03/15/18 1139  GLUCAP 219* 231* 267*    No results found for this or any previous visit (from the past 240 hour(s)).       Radiology Studies: Ct Angio Head W Or Wo Contrast  Result Date: 03/14/2018 CLINICAL DATA:  RIGHT upper extremity weakness and numbness. Onset earlier today. EXAM: CT ANGIOGRAPHY HEAD AND NECK TECHNIQUE: Multidetector CT imaging of the head and neck was performed using the standard protocol during bolus administration of intravenous contrast. Multiplanar CT image reconstructions and MIPs were obtained to evaluate the vascular anatomy. Carotid stenosis measurements (when applicable) are obtained utilizing NASCET criteria, using the distal internal carotid diameter as the denominator. CONTRAST:  75mL ISOVUE-370 IOPAMIDOL (ISOVUE-370) INJECTION 76% COMPARISON:  Noncontrast CT head at 16:27 hours FINDINGS: CTA NECK FINDINGS Aortic arch: Bovine trunk. Imaged portion shows no evidence of aneurysm or dissection. No significant stenosis of the major arch vessel origins. Right carotid  system: No evidence of dissection, stenosis (50% or greater) or occlusion. Left carotid system: There is a long segment of dissection involving the mid common carotid artery on the LEFT with intraluminal thrombus. The dissection extends over 4 cm, and appears to terminate 2 cm below the bifurcation. Estimated 75% stenosis based on luminal measurements of 1.2/4.7 mm. Carotid bifurcation shows only minor atheromatous change. No cervical ICA disease. Vertebral arteries: LEFT vertebral dominant. RIGHT vertebral diminutive with atheromatous change at its origin. No ostial stenosis on the LEFT. Skeleton: Spondylosis.  No worrisome osseous lesion. Other neck: No neck masses. Upper chest: No pneumothorax or mass. Review of the MIP images confirms the above findings CTA HEAD FINDINGS Anterior circulation: Nonstenotic atheromatous change of the petrous and cavernous ICA segments with calcifications. ICA termini widely patent. No M1 or A1 stenosis of significance. No M2 or M3 branch irregularity. Posterior circulation: No significant stenosis, proximal occlusion, aneurysm, or vascular malformation. Venous sinuses: As permitted by contrast timing, patent. Anatomic variants: None of significance.  Fetal LEFT PCA. Delayed phase: There could be developing hypodensity of a  LEFT frontal gyrus near the convexity, see image 26 series 12; this was not clearly present on earlier CT. No abnormal enhancement is observed. 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 No intracranial emboli are identified, but there is subtle hypoattenuation of a LEFT frontal gyrus, which could represent acute infarction. MRI brain recommended for further evaluation. Nonstenotic intracranial atherosclerotic change of the BILATERAL skull base internal carotid arteries. No evidence for emergent large  vessel occlusion. These results were called by telephone at the time of interpretation on 03/14/2018 at 9:44 pm to Dr. Clarene Duke, Who verbally acknowledged these results. Electronically Signed   By: Elsie Stain M.D.   On: 03/14/2018 21:52   Dg Chest 2 View  Result Date: 03/15/2018 CLINICAL DATA:  57 year old male with stroke. EXAM: CHEST - 2 VIEW COMPARISON:  Chest radiograph dated 08/20/2013 FINDINGS: The heart size and mediastinal contours are within normal limits. Both lungs are clear. The visualized skeletal structures are unremarkable. IMPRESSION: No active cardiopulmonary disease. Electronically Signed   By: Elgie Collard M.D.   On: 03/15/2018 01:34   Ct Angio Neck W Or Wo Contrast  Result Date: 03/14/2018 CLINICAL DATA:  RIGHT upper extremity weakness and numbness. Onset earlier today. EXAM: CT ANGIOGRAPHY HEAD AND NECK TECHNIQUE: Multidetector CT imaging of the head and neck was performed using the standard protocol during bolus administration of intravenous contrast. Multiplanar CT image reconstructions and MIPs were obtained to evaluate the vascular anatomy. Carotid stenosis measurements (when applicable) are obtained utilizing NASCET criteria, using the distal internal carotid diameter as the denominator. CONTRAST:  75mL ISOVUE-370 IOPAMIDOL (ISOVUE-370) INJECTION 76% COMPARISON:  Noncontrast CT head at 16:27 hours FINDINGS: CTA NECK FINDINGS Aortic arch: Bovine trunk. Imaged portion shows no evidence of aneurysm or dissection. No significant stenosis of the major arch vessel origins. Right carotid system: No evidence of dissection, stenosis (50% or greater) or occlusion. Left carotid system: There is a long segment of dissection involving the mid common carotid artery on the LEFT with intraluminal thrombus. The dissection extends over 4 cm, and appears to terminate 2 cm below the bifurcation. Estimated 75% stenosis based on luminal measurements of 1.2/4.7 mm. Carotid bifurcation shows only  minor atheromatous change. No cervical ICA disease. Vertebral arteries: LEFT vertebral dominant. RIGHT vertebral diminutive with atheromatous change at its origin. No ostial stenosis on the LEFT. Skeleton: Spondylosis.  No worrisome osseous lesion. Other neck: No neck masses. Upper chest: No pneumothorax or mass. Review of the MIP images confirms the above findings CTA HEAD FINDINGS Anterior circulation: Nonstenotic atheromatous change of the petrous and cavernous ICA segments with calcifications. ICA termini widely patent. No M1 or A1 stenosis of significance. No M2 or M3 branch irregularity. Posterior circulation: No significant stenosis, proximal occlusion, aneurysm, or vascular malformation. Venous sinuses: As permitted by contrast timing, patent. Anatomic variants: None of significance.  Fetal LEFT PCA. Delayed phase: There could be developing hypodensity of a LEFT frontal gyrus near the convexity, see image 26 series 12; this was not clearly present on earlier CT. No abnormal enhancement is observed. 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 No intracranial emboli are identified, but there is subtle hypoattenuation of a LEFT frontal gyrus, which could represent acute infarction. MRI brain recommended for further evaluation. Nonstenotic intracranial  atherosclerotic change of the BILATERAL skull base internal carotid arteries. No evidence for emergent large vessel occlusion. These results were called by telephone at the time of interpretation on 03/14/2018 at 9:44 pm to Dr. Clarene Duke, Who verbally acknowledged these results. Electronically Signed   By: Elsie Stain M.D.   On: 03/14/2018 21:52   Ct Head Code Stroke Wo Contrast  Result Date: 03/14/2018 CLINICAL DATA:  Code stroke. RIGHT-sided numbness for 3 hours. RIGHT upper extremity weakness. EXAM:  CT HEAD WITHOUT CONTRAST TECHNIQUE: Contiguous axial images were obtained from the base of the skull through the vertex without intravenous contrast. COMPARISON:  None. FINDINGS: Brain: No evidence for acute infarction, hemorrhage, mass lesion, hydrocephalus, or extra-axial fluid. Mild premature cerebral and cerebellar atrophy, with ventricular prominence particularly of the fourth ventricle reflecting vermian brain substance loss. Vascular: No hyperdense vessel or unexpected calcification. Skull: Normal. Negative for fracture or focal lesion. Scalp sebaceous cysts. Sinuses/Orbits: Significant opacity of the RIGHT maxillary sinus. Small retention cyst in the LEFT maxillary sinus. Frontal sinuses hypoplastic. Ethmoid sphenoid sinuses unremarkable. Dense lenticular opacities. Other: None. ASPECTS [facility] Stroke Program Early CT Score) - Ganglionic level infarction (caudate, lentiform nuclei, internal capsule, insula, M1-M3 cortex): 7 - Supraganglionic infarction (M4-M6 cortex): 3 Total score (0-10 with 10 being normal): 10 IMPRESSION: 1. Atrophy.  No evidence of acute stroke. 2. ASPECTS is 10. These results were called by telephone at the time of interpretation on 03/14/2018 at 7:36 pm to Dr. Samuel Jester , who verbally acknowledged these results. Electronically Signed   By: Elsie Stain M.D.   On: 03/14/2018 19:42        Scheduled Meds: . atorvastatin  80 mg Oral q1800  . clopidogrel  75 mg Oral Daily  . enoxaparin (LOVENOX) injection  40 mg Subcutaneous Q24H  . insulin aspart  0-9 Units Subcutaneous Q6H  . [START ON 03/16/2018] pneumococcal 23 valent vaccine  0.5 mL Intramuscular Tomorrow-1000   Continuous Infusions: . sodium chloride 10 mL/hr at 03/15/18 0624     LOS: 0 [place]     Marcellus Scott, MD, FACP, [facility] ([place]). [facility] Pager [phone]  If 7PM-7AM, please contact night-coverage [email or web address] Password [facility] 03/15/2018, 2:32 PM

## 2018-03-15 NOTE — Consult Note (Signed)
Neurology Consultation Reason for Consult: Stroke Referring Physician: Sharl MaLama, G  CC: Stroke  History is obtained from: Patient  HPI: Rodney Mcgee is a 57 y.o. male with a history of diabetes, hypertension, hyperlipidemia who presents with right arm weakness that started earlier today.  He apparently laid down for nap around 4:30 PM.  He was evaluated by tele-neurology who felt that though he had had a stroke, he is not a TPA candidate.  I did recommend a CTA to rule out LVO which demonstrates carotid dissection with intramural hematoma. he does not know of any recent neck trauma, nor does he have any neck pain.   LKW: 4:30 PM tpa given?: no, outside of window Premorbid modified rankin scale: 0  ROS: A 14 point ROS was performed and is negative except as noted in the HPI.  Unable to obtain due to altered mental status.   Past Medical History:  Diagnosis Date  . Diabetes mellitus without complication (HCC)   . Hyperlipidemia   . Hypertension   . Tobacco abuse      Family History  Problem Relation Age of Onset  . Alcohol abuse Father      Social History:  reports that he quit smoking about 4 years ago. His smoking use included cigarettes. He smoked 0.20 packs per day. He has never used smokeless tobacco. He reports that he does not drink alcohol or use drugs.   Exam: Current vital signs: BP (!) 164/81 (BP Location: Left Arm)   Pulse 86   Temp 98 F (36.7 C) (Oral)   Resp 18   Ht 5\' 6"  (1.676 m)   Wt 74.8 kg   SpO2 95%   BMI 26.63 kg/m  Vital signs in last 24 hours: Temp:  [98 F (36.7 C)] 98 F (36.7 C) (09/22 0304) Pulse Rate:  [82-101] 86 (09/22 0304) Resp:  [13-24] 18 (09/22 0304) BP: (137-175)/(75-128) 164/81 (09/22 0304) SpO2:  [90 %-100 %] 95 % (09/22 0304) Weight:  [74.8 kg] 74.8 kg (09/21 1853)   Physical Exam  Constitutional: Appears well-developed and well-nourished.  Psych: Affect appropriate to situation Eyes: No scleral injection HENT: No OP  obstrucion Head: Normocephalic.  Cardiovascular: Normal rate and regular rhythm.  Respiratory: Effort normal, non-labored breathing GI: Soft.  No distension. There is no tenderness.  Skin: WDI  Neuro: Mental Status: Patient is awake, alert, oriented to person, place, month, year, and situation. Patient is able to give a clear and coherent history. No signs of aphasia or neglect Cranial Nerves: II: Visual Fields are full. Pupils are equal, round, and reactive to light.   III,IV, VI: EOMI without ptosis or diploplia.  V: Facial sensation is symmetric to temperature VII: Facial movement is with right facial weakness VIII: hearing is intact to voice X: Uvula elevates symmetrically XI: Shoulder shrug is symmetric. XII: tongue is midline without atrophy or fasciculations.  Motor: He has 4/5 proximal, 0/5 distal strength in the right upper extremity, 5/5 in the right lower extremity as well as throughout the left side. 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  Given  intrarenal hematoma, I do think I would favor anticoagulation as opposed to antiplatelet therapy over the long-term for him.  He is already been given Plavix but I would favor low-dose anticoagulation with heparin.  Recommendations: - HgbA1c, fasting lipid panel - MRI  of the brain without contrast - Frequent neuro checks - Echocardiogram - Prophylactic therapy-heparin, stroke protocol as long as MRI without unduly large infarct, already given Plavix for now - Risk factor modification - Telemetry  monitoring - PT consult, OT consult, Speech consult - Stroke team to follow    Ritta Slot, MD [facility] [phone]  If 7pm- 7am, please page neurology on call as listed in AMION.

## 2018-03-15 NOTE — ED Notes (Signed)
Blood glucose finger stick 219

## 2018-03-15 NOTE — Evaluation (Signed)
Occupational Therapy Evaluation Patient Details Name: Rodney Mcgee MRN: [id number] DOB: [birth year] Today's Date: 03/15/2018    History of Present Illness  Rodney Mcgee  is a 57 y.o. male, with history of diabetes mellitus, hypertension, hyperlipidemia, tobacco abuse came to hospital with right hand numbness.  Patient says that he woke up after a nap and noticed numbness of the right upper extremity especially right hand.  He was unable to hold anything with the right hand.  He denies any weakness or numbness in the leg. 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  Subtle hypoattenuation of left frontal gyrus which could represent acute infarction.   Clinical Impression   PTA patient independent and working.  Currently admitted for above and limited by impaired functional use of dominant R UE including strength, coordination, sensation and proprioception affecting ADL and IADL participation. Patient requires setup assist for eating/grooming, min assist for UB and LB ADLs, and min assist for toileting.  Limited evaluation due to transport coming for MRI, but per PT discussion patient independent for transfers.  Based on performance today, patient will benefit from continued OT services while admitted and after dc at outpatient OT level.  Will continue to follow.    Follow Up Recommendations  Outpatient OT;Supervision - Intermittent    Equipment Recommendations  None recommended by OT    Recommendations for Other Services       Precautions / Restrictions Precautions Precaution Comments: Heavy footfalls descending stairs Restrictions Weight Bearing Restrictions: No      Mobility Bed Mobility Overal bed mobility: Modified Independent             General bed mobility comments: supine to EOB  Transfers Overall transfer level:  Independent Equipment used: None             General transfer comment: No difficulty    Balance Overall balance assessment: Mild deficits observed, not formally tested                                         ADL either performed or assessed with clinical judgement   ADL Overall ADL's : Needs assistance/impaired Eating/Feeding: Set up;Sitting   Grooming: Set up;Sitting   Upper Body Bathing: Minimal assistance;Sitting   Lower Body Bathing: Minimal assistance;Sit to/from stand   Upper Body Dressing : Minimal assistance;Sitting   Lower Body Dressing: Minimal assistance;Sit to/from stand       Toileting- Architect and Hygiene: Minimal assistance;Sit to/from stand         General ADL Comments: limited eval as transport for MRI arriving, per PT eval and discussion independent for transfers and mobility     Vision Baseline Vision/History: Wears glasses Wears Glasses: At all times Patient Visual Report: No change from baseline(report blurry vision PTA/hx of cataracts) Additional Comments: further assessment recommended     Perception     Praxis      Pertinent Vitals/Pain Pain Assessment: No/denies pain     Hand Dominance Right   Extremity/Trunk Assessment Upper Extremity Assessment Upper Extremity Assessment: RUE deficits/detail RUE Deficits / Details: 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, dysmetric and brunstrom level IV, able to use a gross stabiizer RUE Sensation: decreased light touch;decreased proprioception RUE Coordination: decreased fine motor;decreased gross motor   Lower Extremity Assessment Lower Extremity Assessment: Defer  to PT evaluation   Cervical / Trunk Assessment Cervical / Trunk Assessment: Normal   Communication Communication Communication: No difficulties   Cognition Arousal/Alertness: Awake/alert Behavior During Therapy: WFL for tasks assessed/performed Overall Cognitive Status: Within Functional Limits for tasks  assessed                                 General Comments: "I'm just taking all of this in"   General Comments  HR 91, BP 146/71, O2 sats 98% post walk    Exercises     Shoulder Instructions      Home Living Family/patient expects to be discharged to:: Private residence Living Arrangements: Non-relatives/Friends Available Help at Discharge: Friend(s);Available PRN/intermittently Type of Home: Other(Comment)(condominium) Home Access: Stairs to enter Entrance Stairs-Number of Steps: 12 Entrance Stairs-Rails: Right;Left(unsure which side) Home Layout: Multi-level Alternate Level Stairs-Number of Steps: 12   Bathroom Shower/Tub: Chief Strategy OfficerTub/shower unit   Bathroom Toilet: Standard     Home Equipment: None          Prior Functioning/Environment Level of Independence: Independent        Comments: [place], [place], [place]; [place] [employer]([place] thinking about staying in [place]?)        OT Problem List: Decreased strength;Decreased range of motion;Decreased coordination;Decreased safety awareness;Impaired UE functional use      OT Treatment/Interventions: Self-care/ADL training;Therapeutic exercise;Neuromuscular education;Energy conservation;Therapeutic activities;Patient/family education;Modalities    OT Goals(Current goals can be found in the care plan section) Acute Rehab OT Goals Patient Stated Goal: Hopes for R hand to get better for return to work OT Goal Formulation: With patient Time For Goal Achievement: 03/29/18 Potential to Achieve Goals: Good  OT Frequency: Min 3X/week   Barriers to D/C:            Co-evaluation              AM-PAC PT "6 Clicks" Daily Activity     Outcome Measure Help from another person eating meals?: A Little Help from another person taking care of personal grooming?: A Little Help from another person toileting, which includes using toliet, bedpan, or urinal?: A Little Help from  another person bathing (including washing, rinsing, drying)?: A Little Help from another person to put on and taking off regular upper body clothing?: A Little Help from another person to put on and taking off regular lower body clothing?: A Little 6 Click Score: 18   End of Session Nurse Communication: Mobility status  Activity Tolerance: Patient tolerated treatment well Patient left: with call bell/phone within reach(seated EOB)  OT Visit Diagnosis: Other symptoms and signs involving the nervous system (R29.898);Hemiplegia and hemiparesis Hemiplegia - Right/Left: Right Hemiplegia - dominant/non-dominant: Dominant Hemiplegia - caused by: Cerebral infarction                Time: [id number] OT Time Calculation (min): 13 min Charges:  OT General Charges $OT Visit: 1 Visit OT Evaluation $OT Eval Moderate Complexity: 1 Mod  Chancy Milroyhristie S Chaniya Genter, OT Acute Rehabilitation Services Pager [phone] Office [phone]   Chancy MilroyChristie S Bert Ptacek 03/15/2018, 10:56 AM

## 2018-03-15 NOTE — H&P (Signed)
TRH H&P    Patient Demographics:    Rodney Mcgee, is a 57 y.o. male  MRN: [id number]  DOB - 03-Jan-1961  Admit Date - 03/14/2018  Referring MD/NP/PA: Dr. Thurnell Garbe  Outpatient Primary MD for the patient is Patient, No Pcp Per  Patient coming from: Home  Chief complaint-right hand numbness   HPI:    Rodney Mcgee  is a 57 y.o. male, with history of diabetes mellitus, hypertension, hyperlipidemia, tobacco abuse came to hospital with right hand numbness.  Patient says that he woke up after a nap and noticed numbness of the right upper extremity especially right hand.  He was unable to hold anything with the right hand.  He denies any weakness or numbness in the leg.  Denies slurred speech.  No vision problems.  Denies chest pain or shortness of breath.  No previous history of stroke or seizure. Code stroke was called, patient was evaluated by [facility] neurology Dr. Graylon Good, no TPA was recommended that patient told him that he woke up with symptoms.  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  Subtle hypoattenuation of left frontal gyrus which could represent acute infarction. Neurology was consulted at [facility], recommended Plavix 300 mg p.o. x1 tonight.  Patient has aspirin allergy. Denies nausea vomiting or diarrhea. Denies fever or chills. Denies chest pain or shortness of breath. No previous history of stroke or seizure. No blurred vision. No slurred speech.   Review of systems:    In addition to the HPI above,    All other systems reviewed and are negative.   With Past History of the following :    Past Medical History:  Diagnosis Date  . Diabetes mellitus without complication ([place])   . Hyperlipidemia   . Hypertension   . Tobacco abuse       History  reviewed. No pertinent surgical history.    Social History:      Social History   Tobacco Use  . Smoking status: Former Smoker    Packs/day: 0.20    Types: Cigarettes    Last attempt to quit: 06/24/2013    Years since quitting: 4.7  . Smokeless tobacco: Never Used  . Tobacco comment: QUIT line info given  Substance Use Topics  . Alcohol use: No       Family History :     Family History  Problem Relation Age of Onset  . Alcohol abuse Father       Home Medications:   Prior to Admission medications   Medication Sig Start Date End Date Taking? Authorizing Provider  glyBURIDE (DIABETA) 5 MG tablet Take 5 mg by mouth daily with breakfast.   Yes [provider]  metFORMIN (GLUCOPHAGE) 1000 MG tablet Take 1 tablet (1,000 mg total) by mouth 2 (two) times daily with a meal. 04/28/14  Yes Otho Bellows, MD  glucose blood (ACCU-CHEK AVIVA) test strip Use as instructed 04/28/14  Otho Bellows, MD     Allergies:     Allergies  Allergen Reactions  . Penicillins Anaphylaxis  . Aspirin   . Lisinopril     REACTION: Questionable angioedema  to lisinopril     Physical Exam:   Vitals  Blood pressure (!) 145/87, pulse 87, temperature 98 F (36.7 C), temperature source Oral, resp. rate 13, height 5' 6" (1.676 m), weight 74.8 kg, SpO2 100 %.  1.  General: Appears in no acute distress  2. Psychiatric:  Intact judgement and  insight, awake alert, oriented x 3.  3. Neurologic:  all cranial nerves intact.Strength 4/5 in right upper extremity, 5/5 in all other extremities, sensation intact all 4 extremities, plantars down going.  4. Eyes :  anicteric sclerae, moist conjunctivae with no lid lag. PERRLA.  5. ENMT:  Oropharynx clear with moist mucous membranes and good dentition  6. Neck:  supple, no cervical lymphadenopathy appriciated, No thyromegaly  7. Respiratory : Normal respiratory effort, good air movement bilaterally,clear to  auscultation  bilaterally  8. Cardiovascular : RRR, no gallops, rubs or murmurs, no leg edema  9. Gastrointestinal:  Positive bowel sounds, abdomen soft, non-tender to palpation,no hepatosplenomegaly, no rigidity or guarding       10. Skin:  No cyanosis, normal texture and turgor, no rash, lesions or ulcers  11.Musculoskeletal:  Good muscle tone,  joints appear normal , no effusions,  normal range of motion    Data Review:    CBC Recent Labs  Lab 03/14/18 1912 03/14/18 1921  WBC 8.4  --   HGB 12.5* 13.9  HCT 38.8* 41.0  PLT 339  --   MCV 70.9*  --   MCH 22.9*  --   MCHC 32.2  --   RDW 13.5  --   LYMPHSABS 2.6  --   MONOABS 0.6  --   EOSABS 0.3  --   BASOSABS 0.0  --    ------------------------------------------------------------------------------------------------------------------  Results for orders placed or performed during the hospital encounter of 03/14/18 (from the past 48 hour(s))  Ethanol     Status: None   Collection Time: 03/14/18  7:12 PM  Result Value Ref Range   Alcohol, Ethyl (B) <10 <10 mg/dL    Comment: (NOTE) Lowest detectable limit for serum alcohol is 10 mg/dL. For medical purposes only. Performed at [facility], [address], [place], [place] [address]   Protime-INR     Status: None   Collection Time: 03/14/18  7:12 PM  Result Value Ref Range   Prothrombin Time 13.0 11.4 - 15.2 seconds   INR 0.99     Comment: Performed at [facility], [address], [place], [place] [address]  APTT     Status: None   Collection Time: 03/14/18  7:12 PM  Result Value Ref Range   aPTT 26 24 - 36 seconds    Comment: Performed at [facility], [address] [place], [place] [address]  CBC     Status: Abnormal   Collection Time: 03/14/18  7:12 PM  Result Value Ref Range   WBC 8.4 4.0 - 10.5 K/uL   RBC 5.47 4.22 - 5.81 MIL/uL   Hemoglobin 12.5 (L) 13.0 - 17.0 g/dL   HCT 38.8 (L) 39.0 - 52.0 %   MCV 70.9 (L) 78.0 - 100.0 fL   MCH 22.9 (L) 26.0 - 34.0 pg   MCHC  32.2 30.0 - 36.0 g/dL   RDW 13.5 11.5 - 15.5 %   Platelets 339  150 - 400 K/uL    Comment: Performed at [facility], [address], [place], [place] [address]  Differential     Status: None   Collection Time: 03/14/18  7:12 PM  Result Value Ref Range   Neutrophils Relative % 58 %   Neutro Abs 4.9 1.7 - 7.7 K/uL   Lymphocytes Relative 31 %   Lymphs Abs 2.6 0.7 - 4.0 K/uL   Monocytes Relative 7 %   Monocytes Absolute 0.6 0.1 - 1.0 K/uL   Eosinophils Relative 4 %   Eosinophils Absolute 0.3 0.0 - 0.7 K/uL   Basophils Relative 0 %   Basophils Absolute 0.0 0.0 - 0.1 K/uL    Comment: Performed at [facility], [address] [place], [place] [address]  Comprehensive metabolic panel     Status: Abnormal   Collection Time: 03/14/18  7:12 PM  Result Value Ref Range   Sodium 134 (L) 135 - 145 mmol/L   Potassium 4.7 3.5 - 5.1 mmol/L   Chloride 102 98 - 111 mmol/L   CO2 24 22 - 32 mmol/L   Glucose, Bld 328 (H) 70 - 99 mg/dL   BUN 16 6 - 20 mg/dL   Creatinine, Ser 0.90 0.61 - 1.24 mg/dL   Calcium 9.1 8.9 - 10.3 mg/dL   Total Protein 7.7 6.5 - 8.1 g/dL   Albumin 3.7 3.5 - 5.0 g/dL   AST 21 15 - 41 U/L   ALT 23 0 - 44 U/L   Alkaline Phosphatase 96 38 - 126 U/L   Total Bilirubin 0.4 0.3 - 1.2 mg/dL   GFR calc non Af Amer >60 >60 mL/min   GFR calc Af Amer >60 >60 mL/min    Comment: (NOTE) The eGFR has been calculated using the CKD EPI equation. This calculation has not been validated in all clinical situations. eGFR's persistently <60 mL/min signify possible Chronic Kidney Disease.    Anion gap 8 5 - 15    Comment: Performed at [facility], [address] [place],  [address]  I-stat troponin, ED (not at [place], [facility])     Status: None   Collection Time: 03/14/18  7:20 PM  Result Value Ref Range   Troponin i, poc 0.03 0.00 - 0.08 ng/mL   Comment 3            Comment: Due to the release kinetics of cTnI, a negative result within the first hours of the onset of symptoms does not  rule out myocardial infarction with certainty. If myocardial infarction is still suspected, repeat the test at appropriate intervals.   I-Stat Chem 8, ED  (not at [facility], [facility])     Status: Abnormal   Collection Time: 03/14/18  7:21 PM  Result Value Ref Range   Sodium 135 135 - 145 mmol/L   Potassium 4.7 3.5 - 5.1 mmol/L   Chloride 101 98 - 111 mmol/L   BUN 17 6 - 20 mg/dL   Creatinine, Ser 0.80 0.61 - 1.24 mg/dL   Glucose, Bld 324 (H) 70 - 99 mg/dL   Calcium, Ion 1.21 1.15 - 1.40 mmol/L   TCO2 24 22 - 32 mmol/L   Hemoglobin 13.9 13.0 - 17.0 g/dL   HCT 41.0 39.0 - 52.0 %  Urine rapid drug screen (hosp performed)not at [facility]     Status: None   Collection Time: 03/14/18  9:40 PM  Result Value Ref Range   Opiates NONE DETECTED NONE DETECTED   Cocaine NONE DETECTED NONE DETECTED  Benzodiazepines NONE DETECTED NONE DETECTED   Amphetamines NONE DETECTED NONE DETECTED   Tetrahydrocannabinol NONE DETECTED NONE DETECTED   Barbiturates NONE DETECTED NONE DETECTED    Comment: (NOTE) DRUG SCREEN FOR MEDICAL PURPOSES ONLY.  IF CONFIRMATION IS NEEDED FOR ANY PURPOSE, NOTIFY LAB WITHIN 5 [place]. LOWEST DETECTABLE LIMITS FOR URINE DRUG SCREEN Drug Class                     Cutoff (ng/mL) Amphetamine and metabolites    1000 Barbiturate and metabolites    200 Benzodiazepine                 433 Tricyclics and metabolites     300 Opiates and metabolites        300 Cocaine and metabolites        300 THC                            50 Performed at [facility], [address] [place], [place] [address]   Urinalysis, Routine w reflex microscopic     Status: Abnormal   Collection Time: 03/14/18  9:40 PM  Result Value Ref Range   Color, Urine YELLOW YELLOW   APPearance CLEAR CLEAR   Specific Gravity, Urine 1.035 (H) 1.005 - 1.030   pH 6.0 5.0 - 8.0   Glucose, UA >=500 (A) NEGATIVE mg/dL   Hgb urine dipstick NEGATIVE NEGATIVE   Bilirubin Urine NEGATIVE NEGATIVE   Ketones, ur NEGATIVE NEGATIVE  mg/dL   Protein, ur 100 (A) NEGATIVE mg/dL   Nitrite NEGATIVE NEGATIVE   Leukocytes, UA NEGATIVE NEGATIVE   RBC / HPF 0-5 0 - 5 RBC/hpf   WBC, UA 0-5 0 - 5 WBC/hpf   Bacteria, UA RARE (A) NONE SEEN   Mucus PRESENT    Hyaline Casts, UA PRESENT     Comment: Performed at [facility], [address], [place],  [address]    Chemistries  Recent Labs  Lab 03/14/18 1912 03/14/18 1921  NA 134* 135  K 4.7 4.7  CL 102 101  CO2 24  --   GLUCOSE 328* 324*  BUN 16 17  CREATININE 0.90 0.80  CALCIUM 9.1  --   AST 21  --   ALT 23  --   ALKPHOS 96  --   BILITOT 0.4  --    ------------------------------------------------------------------------------------------------------------------  ------------------------------------------------------------------------------------------------------------------ GFR: Estimated Creatinine Clearance: 91.9 mL/min (by C-G formula based on SCr of 0.8 mg/dL). Liver Function Tests: Recent Labs  Lab 03/14/18 1912  AST 21  ALT 23  ALKPHOS 96  BILITOT 0.4  PROT 7.7  ALBUMIN 3.7   No results for input(s): LIPASE, AMYLASE in the last 168 hours. No results for input(s): AMMONIA in the last 168 hours. Coagulation Profile: Recent Labs  Lab 03/14/18 1912  INR 0.99    --------------------------------------------------------------------------------------------------------------- Urine analysis:    Component Value Date/Time   COLORURINE YELLOW 03/14/2018 2140   APPEARANCEUR CLEAR 03/14/2018 2140   LABSPEC 1.035 (H) 03/14/2018 2140   PHURINE 6.0 03/14/2018 2140   GLUCOSEU >=500 (A) 03/14/2018 2140   HGBUR NEGATIVE 03/14/2018 2140   BILIRUBINUR NEGATIVE 03/14/2018 2140   KETONESUR NEGATIVE 03/14/2018 2140   PROTEINUR 100 (A) 03/14/2018 2140   UROBILINOGEN 1.0 08/20/2013 1536   NITRITE NEGATIVE 03/14/2018 2140   LEUKOCYTESUR NEGATIVE 03/14/2018 2140      Imaging Results:    Ct Angio Head W Or Wo Contrast  Result Date:  03/14/2018 CLINICAL DATA:  RIGHT upper extremity weakness and numbness. Onset earlier today. EXAM: CT ANGIOGRAPHY HEAD AND NECK TECHNIQUE: Multidetector CT imaging of the head and neck was performed using the standard protocol during bolus administration of intravenous contrast. Multiplanar CT image reconstructions and MIPs were obtained to evaluate the vascular anatomy. Carotid stenosis measurements (when applicable) are obtained utilizing NASCET criteria, using the distal internal carotid diameter as the denominator. CONTRAST:  54m ISOVUE-370 IOPAMIDOL (ISOVUE-370) INJECTION 76% COMPARISON:  Noncontrast CT head at 16:27 hours FINDINGS: CTA NECK FINDINGS Aortic arch: Bovine trunk. Imaged portion shows no evidence of aneurysm or dissection. No significant stenosis of the major arch vessel origins. Right carotid system: No evidence of dissection, stenosis (50% or greater) or occlusion. Left carotid system: There is a long segment of dissection involving the mid common carotid artery on the LEFT with intraluminal thrombus. The dissection extends over 4 cm, and appears to terminate 2 cm below the bifurcation. Estimated 75% stenosis based on luminal measurements of 1.2/4.7 mm. Carotid bifurcation shows only minor atheromatous change. No cervical ICA disease. Vertebral arteries: LEFT vertebral dominant. RIGHT vertebral diminutive with atheromatous change at its origin. No ostial stenosis on the LEFT. Skeleton: Spondylosis.  No worrisome osseous lesion. Other neck: No neck masses. Upper chest: No pneumothorax or mass. Review of the MIP images confirms the above findings CTA HEAD FINDINGS Anterior circulation: Nonstenotic atheromatous change of the petrous and cavernous ICA segments with calcifications. ICA termini widely patent. No M1 or A1 stenosis of significance. No M2 or M3 branch irregularity. Posterior circulation: No significant stenosis, proximal occlusion, aneurysm, or vascular malformation. Venous sinuses: As  permitted by contrast timing, patent. Anatomic variants: None of significance.  Fetal LEFT PCA. Delayed phase: There could be developing hypodensity of a LEFT frontal gyrus near the convexity, see image 26 series 12; this was not clearly present on earlier CT. No abnormal enhancement is observed. 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 No intracranial emboli are identified, but there is subtle hypoattenuation of a LEFT frontal gyrus, which could represent acute infarction. MRI brain recommended for further evaluation. Nonstenotic intracranial atherosclerotic change of the BILATERAL skull base internal carotid arteries. No evidence for emergent large vessel occlusion. These results were called by telephone at the time of interpretation on 03/14/2018 at 9:44 pm to Dr. MThurnell Garbe Who verbally acknowledged these results. Electronically Signed   By: JStaci RighterM.D.   On: 03/14/2018 21:52   Ct Angio Neck W Or Wo Contrast  Result Date: 03/14/2018 CLINICAL DATA:  RIGHT upper extremity weakness and numbness. Onset earlier today. EXAM: CT ANGIOGRAPHY HEAD AND NECK TECHNIQUE: Multidetector CT imaging of the head and neck was performed using the standard protocol during bolus administration of intravenous contrast. Multiplanar CT image reconstructions and MIPs were obtained to evaluate the vascular anatomy. Carotid stenosis measurements (when applicable) are obtained utilizing NASCET criteria, using the distal internal carotid diameter as the denominator. CONTRAST:  757mISOVUE-370 IOPAMIDOL (ISOVUE-370) INJECTION 76% COMPARISON:  Noncontrast CT head at 16:27 hours FINDINGS: CTA NECK FINDINGS Aortic arch: Bovine trunk. Imaged portion shows no evidence of aneurysm or dissection. No significant stenosis of the major arch vessel origins. Right carotid system: No  evidence of dissection, stenosis (50% or greater) or occlusion. Left carotid system: There is a long segment of dissection involving the mid common carotid artery on the LEFT with intraluminal  thrombus. The dissection extends over 4 cm, and appears to terminate 2 cm below the bifurcation. Estimated 75% stenosis based on luminal measurements of 1.2/4.7 mm. Carotid bifurcation shows only minor atheromatous change. No cervical ICA disease. Vertebral arteries: LEFT vertebral dominant. RIGHT vertebral diminutive with atheromatous change at its origin. No ostial stenosis on the LEFT. Skeleton: Spondylosis.  No worrisome osseous lesion. Other neck: No neck masses. Upper chest: No pneumothorax or mass. Review of the MIP images confirms the above findings CTA HEAD FINDINGS Anterior circulation: Nonstenotic atheromatous change of the petrous and cavernous ICA segments with calcifications. ICA termini widely patent. No M1 or A1 stenosis of significance. No M2 or M3 branch irregularity. Posterior circulation: No significant stenosis, proximal occlusion, aneurysm, or vascular malformation. Venous sinuses: As permitted by contrast timing, patent. Anatomic variants: None of significance.  Fetal LEFT PCA. Delayed phase: There could be developing hypodensity of a LEFT frontal gyrus near the convexity, see image 26 series 12; this was not clearly present on earlier CT. No abnormal enhancement is observed. 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 No intracranial emboli are identified, but there is subtle hypoattenuation of a LEFT frontal gyrus, which could represent acute infarction. MRI brain recommended for further evaluation. Nonstenotic intracranial atherosclerotic change of the BILATERAL skull base internal carotid arteries. No evidence for emergent large vessel  occlusion. These results were called by telephone at the time of interpretation on 03/14/2018 at 9:44 pm to Dr. Thurnell Garbe, Who verbally acknowledged these results. Electronically Signed   By: Staci Righter M.D.   On: 03/14/2018 21:52   Ct Head Code Stroke Wo Contrast  Result Date: 03/14/2018 CLINICAL DATA:  Code stroke. RIGHT-sided numbness for 3 hours. RIGHT upper extremity weakness. EXAM: CT HEAD WITHOUT CONTRAST TECHNIQUE: Contiguous axial images were obtained from the base of the skull through the vertex without intravenous contrast. COMPARISON:  None. FINDINGS: Brain: No evidence for acute infarction, hemorrhage, mass lesion, hydrocephalus, or extra-axial fluid. Mild premature cerebral and cerebellar atrophy, with ventricular prominence particularly of the fourth ventricle reflecting vermian brain substance loss. Vascular: No hyperdense vessel or unexpected calcification. Skull: Normal. Negative for fracture or focal lesion. Scalp sebaceous cysts. Sinuses/Orbits: Significant opacity of the RIGHT maxillary sinus. Small retention cyst in the LEFT maxillary sinus. Frontal sinuses hypoplastic. Ethmoid sphenoid sinuses unremarkable. Dense lenticular opacities. Other: None. ASPECTS [facility] Stroke Program Early CT Score) - Ganglionic level infarction (caudate, lentiform nuclei, internal capsule, insula, M1-M3 cortex): 7 - Supraganglionic infarction (M4-M6 cortex): 3 Total score (0-10 with 10 being normal): 10 IMPRESSION: 1. Atrophy.  No evidence of acute stroke. 2. ASPECTS is 10. These results were called by telephone at the time of interpretation on 03/14/2018 at 7:36 pm to Dr. Francine Graven , who verbally acknowledged these results. Electronically Signed   By: Staci Righter M.D.   On: 03/14/2018 19:42    My personal review of EKG: Rhythm NSR   Assessment & Plan:    Active Problems:   Stroke (cerebrum) ([place])   1. Right hand weakness-concern for CVA, CT Angie of the neck shows left common carotid artery  dissection 4 cm.  Will initiate stroke work-up.  ED physician discussed with neurologist Dr. Leonel Ramsay, patient will be transferred to [facility] for further evaluation.  Plavix 300 mg p.o. x1 given in the ED.  Continue Plavix 75 mg p.o. Daily.  2. Diabetes  mellitus-hold oral hypoglycemic agents, start sliding scale insulin with NovoLog.  Check CBG every 6 hours.  3. Hypertension-hold antihypertensive medications for permissive hypertension.  Patient will be transferred to [facility], Dr. Blaine Hamper has accepted the patient in transfer  DVT Prophylaxis-   Lovenox   AM Labs Ordered, also please review Full Orders  Family Communication: Admission, patients condition and plan of care including tests being ordered have been discussed with the patient and his wife and daughter at bedside who indicate understanding and agree with the plan and Code Status.  Code Status: Full code  Admission status: Inpatient  Time spent in minutes : 60 minutes   Oswald Hillock M.D on 03/15/2018 at 12:56 AM  Between 7am to 7pm - Pager - [phone]. After 7pm go to [email or web address] - password [facility]  [facility] - Office  [phone]

## 2018-03-15 NOTE — Progress Notes (Signed)
STROKE TEAM PROGRESS NOTE   SUBJECTIVE (INTERVAL HISTORY) His daughter is at the bedside.  Patient awake alert, no complaints.  Still has right hand weakness, otherwise no significant neuro deficit.  CT head and neck showed left CCA possible dissection with thrombus formation.  MRI pending.    OBJECTIVE Vitals:   03/15/18 0601 03/15/18 0624 03/15/18 0705 03/15/18 0706  BP: (!) 201/103 (!) 166/88 (!) 162/96 (!) 162/96  Pulse: 95 91 89 86  Resp:    18  Temp:    97.8 F (36.6 C)  TempSrc:    Oral  SpO2: 94% 97% 99% 98%  Weight:      Height:        CBC:  Recent Labs  Lab 03/14/18 1912 03/14/18 1921 03/15/18 0428  WBC 8.4  --  7.8  NEUTROABS 4.9  --   --   HGB 12.5* 13.9 12.1*  HCT 38.8* 41.0 40.4  MCV 70.9*  --  72.3*  PLT 339  --  332    Basic Metabolic Panel:  Recent Labs  Lab 03/14/18 1912 03/14/18 1921 03/15/18 0428  NA 134* 135  --   K 4.7 4.7  --   CL 102 101  --   CO2 24  --   --   GLUCOSE 328* 324*  --   BUN 16 17  --   CREATININE 0.90 0.80 0.73  CALCIUM 9.1  --   --     Lipid Panel:     Component Value Date/Time   CHOL 202 (H) 03/15/2018 0428   TRIG 117 03/15/2018 0428   HDL 29 (L) 03/15/2018 0428   CHOLHDL 7.0 03/15/2018 0428   VLDL 23 03/15/2018 0428   LDLCALC 150 (H) 03/15/2018 0428   HgbA1c:  Lab Results  Component Value Date   HGBA1C >14.0 08/25/2014   Urine Drug Screen:     Component Value Date/Time   LABOPIA NONE DETECTED 03/14/2018 2140   COCAINSCRNUR NONE DETECTED 03/14/2018 2140   LABBENZ NONE DETECTED 03/14/2018 2140   AMPHETMU NONE DETECTED 03/14/2018 2140   THCU NONE DETECTED 03/14/2018 2140   LABBARB NONE DETECTED 03/14/2018 2140    Alcohol Level     Component Value Date/Time   ETH <10 03/14/2018 1912    IMAGING   Ct Angio Head W Or Wo Contrast Ct Angio Neck W Or Wo Contrast 03/14/2018 IMPRESSION:  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  No intracranial emboli are identified, but there is subtle hypoattenuation of a LEFT frontal gyrus, which could represent acute infarction.  MRI brain recommended for further evaluation.  Nonstenotic intracranial atherosclerotic change of the BILATERAL skull base internal carotid arteries.  No evidence for emergent large vessel occlusion.    MRI Brain WO Contrast - pending    Ct Head Code Stroke Wo Contrast 03/14/2018 IMPRESSION:  1. Atrophy.  No evidence of acute stroke.  2. ASPECTS is 10.   Transthoracic Echocardiogram - pending    PHYSICAL EXAM Temp:  [97.6 F (36.4 C)-98.1 F (36.7 C)] 98.1 F (36.7 C) (09/22 1538) Pulse Rate:  [82-101] 88 (09/22 1538) Resp:  [13-24] 18 (09/22 1538) BP: (127-201)/(48-128) 128/69 (09/22 1538) SpO2:  [90 %-100 %] 97 % (09/22 1538) Weight:  [74.8 kg] 74.8 kg (09/21 1853)  General - Well nourished, well developed, in no apparent distress.  Ophthalmologic - fundi not visualized due to noncooperation.  Cardiovascular - Regular rate and rhythm.  Mental Status -  Level of arousal and orientation to time, place, and person were intact. Language including expression, naming, repetition, comprehension was assessed and found intact. Attention span and concentration were normal. Fund of Knowledge was assessed and was intact.  Cranial Nerves II - XII - II - Visual field intact OU. III, IV, VI - Extraocular movements intact. V - Facial sensation intact bilaterally. VII - Facial movement intact bilaterally. VIII - Hearing & vestibular intact bilaterally. X - Palate elevates symmetrically. XI - Chin turning & shoulder shrug intact bilaterally. XII - Tongue protrusion intact.  Motor Strength - The patient's strength was normal in all extremities and pronator drift was absent except right hand grip 3/5, dexterity difficulties, 3/5 right wrist extension, 4+/5 tricep.  Bulk was normal and fasciculations were absent.    Motor Tone - Muscle tone was assessed at the neck and appendages and was normal.  Reflexes - The patient's reflexes were symmetrical in all extremities and he had no pathological reflexes.  Sensory - Light touch, temperature/pinprick were assessed and were symmetrical.    Coordination - The patient had normal movements in the hands with no ataxia or dysmetria.  Tremor was absent.  Gait and Station - deferred.    ASSESSMENT/PLAN [name] is a 57 y.o. male with history of diabetes mellitus, hyperlipidemia, hypertension, and previous tobacco use presenting with right arm weakness. He did not receive IV t-PA due to late presentation.  Stroke: Suspect LEFT frontal infarct, embolic from Lt carotid dissection and mural thrombus.  Resultant right hand weakness  CT head - subtle hypoattenuation of a LEFT frontal gyrus, which could represent acute infarction.   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   2D Echo - pending  LDL - 150  HgbA1c - 10.9  HIV negative  VTE prophylaxis - heparin IV  Diet - Heart healthy / carb modified with thin liquids.  No antithrombotic prior to admission, now on clopidogrel 75 mg daily.  Recommend heparin IV for left carotid mural thrombus.  Once stable, transition to Coumadin with Lovenox bridge.  Repeat CTA neck in 2 to 3 months, if no thrombus resolved, Coumadin can be switched to aspirin 81.  Patient counseled to be compliant with his antithrombotic medications  Ongoing aggressive stroke risk factor management  Therapy recommendations: Outpatient occupational therapy recommended  Disposition:  Pending  Left CCA dissection with mural thrombus  Patient refused neck trauma and recent neck manipulation  Admitted that he may have abrupt neck movement at work  Recommend heparin IV for now, once stable, transition to Coumadin with Lovenox bridge.   Repeat CTA neck in 2 to 3 months.  Hypertension  Stable  Permissive hypertension (OK if <180/105 but gradually normalize in 5-7 [place] . Long-term BP goal normotensive  Hyperlipidemia  Lipid lowering medication PTA:  none  LDL 150, goal < 70  Current lipid lowering medication: Lipitor 80 mg daily  Continue statin at discharge  Diabetes  HgbA1c 10.9, goal < 7.0  Uncontrolled  Hyperglycemia  SSI  CBG monitoring  Close PCP follow-up for better DM control  Other Stroke Risk Factor  Former cigarette smoker - quit 2 years ago  Other Active Problems  Hyperglycemia  Hospital day # 0  Marvel Plan, MD PhD Stroke Neurology 03/15/2018 5:56 PM   To contact Stroke Continuity provider, please refer to [email or web address]. After hours, contact General Neurology

## 2018-03-15 NOTE — Plan of Care (Signed)
Patient stable, discussed POC with patient, agreeable with plan, denies question/concerns at this time.  

## 2018-03-15 NOTE — Progress Notes (Signed)
ANTICOAGULATION CONSULT NOTE - Follow Up Consult  Pharmacy Consult for heparin Indication: stroke/carotid dissection  Allergies  Allergen Reactions  . Penicillins Anaphylaxis  . Aspirin   . Lisinopril     REACTION: Questionable angioedema  to lisinopril    Patient Measurements: Height: 5\' 6"  (167.6 cm) Weight: 165 lb (74.8 kg) IBW/kg (Calculated) : 63.8 Heparin Dosing Weight: 75kg  Vital Signs: Temp: 98.1 F (36.7 C) (09/22 1538) Temp Source: Oral (09/22 1538) BP: 128/69 (09/22 1538) Pulse Rate: 88 (09/22 1538)  Labs: Recent Labs    03/14/18 1912 03/14/18 1921 03/15/18 0428  HGB 12.5* 13.9 12.1*  HCT 38.8* 41.0 40.4  PLT 339  --  332  APTT 26  --   --   LABPROT 13.0  --   --   INR 0.99  --   --   CREATININE 0.90 0.80 0.73    Estimated Creatinine Clearance: 91.9 mL/min (by C-G formula based on SCr of 0.73 mg/dL).   Medications:  Medications Prior to Admission  Medication Sig Dispense Refill Last Dose  . glyBURIDE (DIABETA) 5 MG tablet Take 5 mg by mouth daily with breakfast.   Past Month at Unknown time  . metFORMIN (GLUCOPHAGE) 1000 MG tablet Take 1 tablet (1,000 mg total) by mouth 2 (two) times daily with a meal. 60 tablet  Past Month at Unknown time  . glucose blood (ACCU-CHEK AVIVA) test strip Use as instructed 100 each  Not Taking   Scheduled:  . atorvastatin  80 mg Oral q1800  . insulin aspart  0-15 Units Subcutaneous TID WC  . insulin aspart  0-5 Units Subcutaneous QHS  . insulin glargine  10 Units Subcutaneous Daily  . [START ON 03/16/2018] pneumococcal 23 valent vaccine  0.5 mL Intramuscular Tomorrow-1000   Infusions:  . sodium chloride 10 mL/hr at 03/15/18 1500    Assessment: Rodney Mcgee was recently admitted for right arm weakness. His CT showed carotid dissection that led to CVA. Neurology is recommending low dose anticoagulation with IV heparin then transition to coumadin.   Goal of Therapy:  Heparin level 0.3-0.5 units/ml Monitor platelets by  anticoagulation protocol: Yes   Plan:   Heparin infusion at 1050 units/hr F/u with 6 hr heparin level Daily level and CBC  Rodney SouthwardMinh Shimshon Mcgee, PharmD, BCIDP, AAHIVP, CPP Infectious Disease Pharmacist Pager: [phone] 03/15/2018 5:06 PM

## 2018-03-15 NOTE — Evaluation (Signed)
Physical Therapy Evaluation Patient Details Name: Rodney CheadleRobert L Mcgee MRN: [id number] DOB: 01/04/1961 Today's Date: 03/15/2018   History of Present Illness   Rodney MaduroRobert Mcgee  is a 57 y.o. male, with history of diabetes mellitus, hypertension, hyperlipidemia, tobacco abuse came to hospital with right hand numbness.  Patient says that he woke up after a nap and noticed numbness of the right upper extremity especially right hand.  He was unable to hold anything with the right hand.  He denies any weakness or numbness in the leg. 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  Subtle hypoattenuation of left frontal gyrus which could represent acute infarction.  Clinical Impression   Pt admitted with above diagnosis. Pt currently with functional limitations due to the deficits listed below (see PT Problem List). 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       Follow Up Recommendations Other (comment)(Outpatient OT/Hand therapy)    Equipment Recommendations  None recommended by PT    Recommendations for Other Services OT consult(as ordered)     Precautions / Restrictions Precautions Precaution Comments: Heavy footfalls descending stairs      Mobility  Bed Mobility Overal bed mobility: Modified Independent             General bed mobility comments: Increased time for management of bedclothes  Transfers Overall transfer level: Independent Equipment used: None             General transfer comment: No  difficulty  Ambulation/Gait Ambulation/Gait assistance: Supervision;Independent Gait Distance (Feet): 200 Feet Assistive device: IV Pole;None Gait Pattern/deviations: Step-through pattern Gait velocity: slightly slow   General Gait Details: Supervision initially, progressing to modified independent, with slightly slower gait while we walked and talked about sings and symptoms of CVA and modifiable risk factors  Stairs Stairs: Yes Stairs assistance: Min guard(without  physical contact) Stair Management: Forwards;One rail Left;Alternating pattern Number of Stairs: 4 General stair comments: Noted  decr eccentric control with descending, heavy foot fall, slight unsteadiness with greater dependence on rails  Wheelchair Mobility    Modified Rankin (Stroke Patients Only) Modified Rankin (Stroke Patients Only) Pre-Morbid Rankin Score: No symptoms Modified Rankin: Slight disability     Balance Overall balance assessment: Mild deficits observed, not formally tested                                           Pertinent Vitals/Pain Pain Assessment: No/denies pain    Home Living Family/patient expects to be discharged to:: Private residence Living Arrangements: Non-relatives/Friends Available Help at Discharge: Friend(s);Available PRN/intermittently Type of Home: Other(Comment)(Condominium) Home Access: Stairs to enter Entrance Stairs-Rails: Right;Left(Unsure which side) Entrance Stairs-Number of Steps: 12 Home Layout: Multi-level        Prior Function Level of Independence: Independent         Comments: Lives in [place], [place], [place]; [place] at [employer]     Hand Dominance   Dominant Hand: Right    Extremity/Trunk Assessment   Upper  Extremity Assessment Upper Extremity Assessment: Defer to OT evaluation(Gross weakness RUE; unable to actively move R fingers on the day of admission; now with active R hand opening and  closing)    Lower Extremity Assessment Lower Extremity Assessment: Overall WFL for tasks assessed    Cervical / Trunk Assessment Cervical / Trunk Assessment: Normal  Communication   Communication: No difficulties  Cognition Arousal/Alertness: Awake/alert Behavior During Therapy: WFL for tasks assessed/performed Overall Cognitive Status: Within Functional Limits for tasks assessed(for simple mobility tasks)                                 General Comments: "I'm just taking all of this in"      General Comments General comments (skin integrity, edema, etc.): HR 91, BP 146/71, O2 sats 98% post walk    Exercises     Assessment/Plan    PT Assessment Patient needs continued PT services  PT Problem List Decreased strength;Decreased balance;Decreased coordination       PT Treatment Interventions Gait training;Stair training;Functional mobility training;Therapeutic activities;Therapeutic exercise;Balance training;Neuromuscular re-education;Patient/family education    PT Goals (Current goals can be found in the Care Plan section)  Acute Rehab PT Goals Patient Stated Goal: Hopes for R hand to get better for return to work PT Goal Formulation: With patient Time For Goal Achievement: 03/22/18 Potential to Achieve Goals: Good    Frequency Min 4X/week   Barriers to discharge        Co-evaluation               AM-PAC PT "6 Clicks" Daily Activity  Outcome Measure Difficulty turning over in bed (including adjusting bedclothes, sheets and blankets)?: A Little Difficulty moving from lying on back to sitting on the side of the bed? : A Little Difficulty sitting down on and standing up from a chair with arms (e.g., wheelchair, bedside commode, etc,.)?: None Help needed moving to and from a bed to chair (including a wheelchair)?: None Help needed walking in hospital room?: None Help needed climbing 3-5 steps with a railing? : None 6 Click Score: 22    End of  Session Equipment Utilized During Treatment: Gait belt Activity Tolerance: Patient tolerated treatment well Patient left: in chair;with call bell/phone within reach Nurse Communication: Mobility status PT Visit Diagnosis: Unsteadiness on feet (R26.81);Other abnormalities of gait and mobility (R26.89)    Time: 1610-9604 PT Time Calculation (min) (ACUTE ONLY): 25 min   Charges:   PT Evaluation $PT Eval Low Complexity: 1 Low PT Treatments $Gait Training: 8-22 mins        Van Clines, PT  Acute Rehabilitation Services Pager [phone] Office [phone]   Levi Aland 03/15/2018, 9:34 AM

## 2018-03-16 ENCOUNTER — Inpatient Hospital Stay (HOSPITAL_COMMUNITY): Payer: 59

## 2018-03-16 DIAGNOSIS — R739 Hyperglycemia, unspecified: Secondary | ICD-10-CM

## 2018-03-16 DIAGNOSIS — I5030 Unspecified diastolic (congestive) heart failure: Secondary | ICD-10-CM

## 2018-03-16 DIAGNOSIS — E785 Hyperlipidemia, unspecified: Secondary | ICD-10-CM

## 2018-03-16 DIAGNOSIS — Z720 Tobacco use: Secondary | ICD-10-CM

## 2018-03-16 DIAGNOSIS — F17200 Nicotine dependence, unspecified, uncomplicated: Secondary | ICD-10-CM

## 2018-03-16 DIAGNOSIS — E1149 Type 2 diabetes mellitus with other diabetic neurological complication: Secondary | ICD-10-CM

## 2018-03-16 LAB — CBC
HEMATOCRIT: 39.6 % (ref 39.0–52.0)
HEMOGLOBIN: 11.8 g/dL — AB (ref 13.0–17.0)
MCH: 21.5 pg — AB (ref 26.0–34.0)
MCHC: 29.8 g/dL — AB (ref 30.0–36.0)
MCV: 72.1 fL — AB (ref 78.0–100.0)
Platelets: 349 10*3/uL (ref 150–400)
RBC: 5.49 MIL/uL (ref 4.22–5.81)
RDW: 13.5 % (ref 11.5–15.5)
WBC: 8.8 10*3/uL (ref 4.0–10.5)

## 2018-03-16 LAB — ECHOCARDIOGRAM COMPLETE
Height: 66 in
WEIGHTICAEL: 2640 [oz_av]

## 2018-03-16 LAB — GLUCOSE, CAPILLARY
GLUCOSE-CAPILLARY: 225 mg/dL — AB (ref 70–99)
Glucose-Capillary: 305 mg/dL — ABNORMAL HIGH (ref 70–99)
Glucose-Capillary: 167 mg/dL — ABNORMAL HIGH (ref 70–99)
Glucose-Capillary: 164 mg/dL — ABNORMAL HIGH (ref 70–99)

## 2018-03-16 LAB — HEPARIN LEVEL (UNFRACTIONATED)
Heparin Unfractionated: 0.17 IU/mL — ABNORMAL LOW (ref 0.30–0.70)
Heparin Unfractionated: 0.53 IU/mL (ref 0.30–0.70)

## 2018-03-16 MED ORDER — ENOXAPARIN SODIUM 80 MG/0.8ML ~~LOC~~ SOLN
1.0000 mg/kg | Freq: Two times a day (BID) | SUBCUTANEOUS | Status: DC
  Administered 2018-03-16 – 2018-03-21 (×10): 75 mg via SUBCUTANEOUS
  Filled 2018-03-16 (×10): qty 0.8

## 2018-03-16 MED ORDER — WARFARIN - PHARMACIST DOSING INPATIENT
Freq: Every day | Status: DC
  Administered 2018-03-20: 17:00:00

## 2018-03-16 MED ORDER — WARFARIN SODIUM 7.5 MG PO TABS
7.5000 mg | ORAL_TABLET | Freq: Once | ORAL | Status: AC
  Administered 2018-03-16: 7.5 mg via ORAL
  Filled 2018-03-16: qty 1

## 2018-03-16 NOTE — Progress Notes (Signed)
PROGRESS NOTE   Rodney Mcgee  RUE:[id number]    DOB: 05/03/1961    DOA: 03/14/2018  PCP: Patient, No Pcp Per   I have briefly reviewed patients previous medical records in [facility].  Brief Narrative:  57 year old male, resident of [place] who was visiting family in [place], [place], works in the Psychologist, forensic at [employer], PMH of DM 2, HTN, HLD, tobacco abuse presented initially to [facility] with sudden onset of right upper extremity numbness and weakness without any other stroke like symptoms that started around 4:30 PM on 9/21, code stroke initiated, evaluated by telemetry neurology, no TPA administered, CT of head and neck showed long segment left CCA dissection with intraluminal thrombus and possible left frontal gyrus acute infarction, Neurology at [facility] was consulted and patient transferred for further evaluation and management. Stroke team consulted.   Assessment & Plan:   Active Problems:   Stroke (cerebrum) (HCC)   Acute/subacute left MCA embolic stroke: Resultant right upper extremity weakness.  Etiology: Embolic from left CCA dissection.  CT head 9/21: No evidence of acute stroke.  MRI brain 03/15/2018: Several foci of acute/early subacute infarction throughout the left MCA distribution.  Positive for petechial hemorrhage.  CTA head 9/21: Subtle hypoattenuation of the left frontal gyrus which could represent acute stroke.  CTA neck: Long segment left CCA dissection, extending over approximately 4 cm, with intraluminal thrombus, estimated 75% stenosis.  LDL 150.  A1c: 10.9.  2D echo to be done.  Therapies recommend outpatient OT.  Neurology/stroke service consulted, received Plavix 300 mg at [facility].  Stroke MD follow-up appreciated: Plavix discontinued.  Recommending IV heparin bridge for left carotid mural thrombus and once stable transition to Lovenox bridge + Coumadin, repeat CTA neck in 2 to 3 months and if no thrombus then Coumadin can be switched to aspirin 81 mg  daily.  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  As per neurology, 3 months anticoagulation.  IV heparin bridge and Coumadin.  Uncontrolled DM 2: A1c 10.9.  Oral hypoglycemics held.  Poorly controlled on SSI alone.  Started Lantus 10 units daily.  Discussed with patient that DC home on insulin is better DM control given above vascular complications.  He verbalized understanding.  He does not have insurance, will consult case management.  CBGs better after initiation of Lantus.  Essential hypertension: Allowing permissive hypertension due to acute stroke.  Not on antihypertensives PTA.  Hyperlipidemia: LDL 150.  Atorvastatin 80 mg daily.  Microcytic anemia: Unclear etiology.  Now on IV heparin infusion.  Follow CBC daily.    DVT prophylaxis: Currently on IV heparin infusion. Code Status: Full Family Communication: None at bed Disposition: DC home pending clinical improvement and further evaluation and management.   Consultants:  Neurology  Procedures:  None  Antimicrobials:  None   Subjective: No change in right upper extremity weakness.  Denies any other complaints.  ROS: As above, otherwise negative.  Objective:  Vitals:   03/15/18 1538 03/15/18 1939 03/16/18 0012 03/16/18 0406  BP: 128/69 (!) 147/79 (!) 142/68 140/78  Pulse: 88 91 92 90  Resp: 18 18 18 18   Temp: 98.1 F (36.7 C) 97.7 F (36.5 C) (!) 97.5 F (36.4 C) 98 F (36.7 C)  TempSrc: Oral Oral Oral Oral  SpO2: 97% 100% 100% 97%  Weight:      Height:        Examination:  General exam: Pleasant young male, moderately built and nourished, lying comfortably  supine in bed. Respiratory system: Clear to auscultation. Respiratory effort normal.  Stable Cardiovascular system: S1 & S2 heard, RRR. No JVD, murmurs, rubs, gallops or clicks. No pedal edema.  Telemetry personally reviewed: Sinus rhythm.  Stable Gastrointestinal system: Abdomen is nondistended, soft and  nontender. No organomegaly or masses felt. Normal bowel sounds heard.  Stable Central nervous system: Alert and oriented. No cranial nerve deficits.  Right upper extremity persisting grade 4 x 5 power without compared to yesterday. Neck: No carotid bruit appreciated. Extremities: Symmetric 5 x 5 power. Skin: No rashes, lesions or ulcers Psychiatry: Judgement and insight appear normal. Mood & affect appropriate.     Data Reviewed: I have personally reviewed following labs and imaging studies  CBC: Recent Labs  Lab 03/14/18 1912 03/14/18 1921 03/15/18 0428 03/16/18 0002  WBC 8.4  --  7.8 8.8  NEUTROABS 4.9  --   --   --   HGB 12.5* 13.9 12.1* 11.8*  HCT 38.8* 41.0 40.4 39.6  MCV 70.9*  --  72.3* 72.1*  PLT 339  --  332 349   Basic Metabolic Panel: Recent Labs  Lab 03/14/18 1912 03/14/18 1921 03/15/18 0428  NA 134* 135  --   K 4.7 4.7  --   CL 102 101  --   CO2 24  --   --   GLUCOSE 328* 324*  --   BUN 16 17  --   CREATININE 0.90 0.80 0.73  CALCIUM 9.1  --   --    Liver Function Tests: Recent Labs  Lab 03/14/18 1912  AST 21  ALT 23  ALKPHOS 96  BILITOT 0.4  PROT 7.7  ALBUMIN 3.7   Coagulation Profile: Recent Labs  Lab 03/14/18 1912  INR 0.99   CBG: Recent Labs  Lab 03/15/18 0158 03/15/18 0619 03/15/18 1139 03/15/18 1741 03/15/18 2330  GLUCAP 219* 231* 267* 214* 158*    No results found for this or any previous visit (from the past 240 hour(s)).       Radiology Studies: Ct Angio Head W Or Wo Contrast  Result Date: 03/14/2018 CLINICAL DATA:  RIGHT upper extremity weakness and numbness. Onset earlier today. EXAM: CT ANGIOGRAPHY HEAD AND NECK TECHNIQUE: Multidetector CT imaging of the head and neck was performed using the standard protocol during bolus administration of intravenous contrast. Multiplanar CT image reconstructions and MIPs were obtained to evaluate the vascular anatomy. Carotid stenosis measurements (when applicable) are obtained  utilizing NASCET criteria, using the distal internal carotid diameter as the denominator. CONTRAST:  75mL ISOVUE-370 IOPAMIDOL (ISOVUE-370) INJECTION 76% COMPARISON:  Noncontrast CT head at 16:27 hours FINDINGS: CTA NECK FINDINGS Aortic arch: Bovine trunk. Imaged portion shows no evidence of aneurysm or dissection. No significant stenosis of the major arch vessel origins. Right carotid system: No evidence of dissection, stenosis (50% or greater) or occlusion. Left carotid system: There is a long segment of dissection involving the mid common carotid artery on the LEFT with intraluminal thrombus. The dissection extends over 4 cm, and appears to terminate 2 cm below the bifurcation. Estimated 75% stenosis based on luminal measurements of 1.2/4.7 mm. Carotid bifurcation shows only minor atheromatous change. No cervical ICA disease. Vertebral arteries: LEFT vertebral dominant. RIGHT vertebral diminutive with atheromatous change at its origin. No ostial stenosis on the LEFT. Skeleton: Spondylosis.  No worrisome osseous lesion. Other neck: No neck masses. Upper chest: No pneumothorax or mass. Review of the MIP images confirms the above findings CTA HEAD FINDINGS Anterior circulation: Nonstenotic atheromatous  change of the petrous and cavernous ICA segments with calcifications. ICA termini widely patent. No M1 or A1 stenosis of significance. No M2 or M3 branch irregularity. Posterior circulation: No significant stenosis, proximal occlusion, aneurysm, or vascular malformation. Venous sinuses: As permitted by contrast timing, patent. Anatomic variants: None of significance.  Fetal LEFT PCA. Delayed phase: There could be developing hypodensity of a LEFT frontal gyrus near the convexity, see image 26 series 12; this was not clearly present on earlier CT. No abnormal enhancement is observed. Review of the MIP images confirms the above findings IMPRESSION: Long segment LEFT common carotid artery dissection, extending over  approximately 4 cm, with intraluminal thrombus, estimated 75% stenosis. No intracranial emboli are identified, but there is subtle hypoattenuation of a LEFT frontal gyrus, which could represent acute infarction. MRI brain recommended for further evaluation. Nonstenotic intracranial atherosclerotic change of the BILATERAL skull base internal carotid arteries. No evidence for emergent large vessel occlusion. These results were called by telephone at the time of interpretation on 03/14/2018 at 9:44 pm to Dr. Clarene Duke, Who verbally acknowledged these results. Electronically Signed   By: Elsie Stain M.D.   On: 03/14/2018 21:52   Dg Chest 2 View  Result Date: 03/15/2018 CLINICAL DATA:  57 year old male with stroke. EXAM: CHEST - 2 VIEW COMPARISON:  Chest radiograph dated 08/20/2013 FINDINGS: The heart size and mediastinal contours are within normal limits. Both lungs are clear. The visualized skeletal structures are unremarkable. IMPRESSION: No active cardiopulmonary disease. Electronically Signed   By: Elgie Collard M.D.   On: 03/15/2018 01:34   Ct Angio Neck W Or Wo Contrast  Result Date: 03/14/2018 CLINICAL DATA:  RIGHT upper extremity weakness and numbness. Onset earlier today. EXAM: CT ANGIOGRAPHY HEAD AND NECK TECHNIQUE: Multidetector CT imaging of the head and neck was performed using the standard protocol during bolus administration of intravenous contrast. Multiplanar CT image reconstructions and MIPs were obtained to evaluate the vascular anatomy. Carotid stenosis measurements (when applicable) are obtained utilizing NASCET criteria, using the distal internal carotid diameter as the denominator. CONTRAST:  75mL ISOVUE-370 IOPAMIDOL (ISOVUE-370) INJECTION 76% COMPARISON:  Noncontrast CT head at 16:27 hours FINDINGS: CTA NECK FINDINGS Aortic arch: Bovine trunk. Imaged portion shows no evidence of aneurysm or dissection. No significant stenosis of the major arch vessel origins. Right carotid system: No  evidence of dissection, stenosis (50% or greater) or occlusion. Left carotid system: There is a long segment of dissection involving the mid common carotid artery on the LEFT with intraluminal thrombus. The dissection extends over 4 cm, and appears to terminate 2 cm below the bifurcation. Estimated 75% stenosis based on luminal measurements of 1.2/4.7 mm. Carotid bifurcation shows only minor atheromatous change. No cervical ICA disease. Vertebral arteries: LEFT vertebral dominant. RIGHT vertebral diminutive with atheromatous change at its origin. No ostial stenosis on the LEFT. Skeleton: Spondylosis.  No worrisome osseous lesion. Other neck: No neck masses. Upper chest: No pneumothorax or mass. Review of the MIP images confirms the above findings CTA HEAD FINDINGS Anterior circulation: Nonstenotic atheromatous change of the petrous and cavernous ICA segments with calcifications. ICA termini widely patent. No M1 or A1 stenosis of significance. No M2 or M3 branch irregularity. Posterior circulation: No significant stenosis, proximal occlusion, aneurysm, or vascular malformation. Venous sinuses: As permitted by contrast timing, patent. Anatomic variants: None of significance.  Fetal LEFT PCA. Delayed phase: There could be developing hypodensity of a LEFT frontal gyrus near the convexity, see image 26 series 12; this was not clearly present  on earlier CT. No abnormal enhancement is observed. 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 No intracranial emboli are identified, but there is subtle hypoattenuation of a LEFT frontal gyrus, which could represent acute infarction. MRI brain recommended for further evaluation. Nonstenotic intracranial atherosclerotic change of the BILATERAL skull base internal carotid arteries. No evidence for emergent large vessel  occlusion. These results were called by telephone at the time of interpretation on 03/14/2018 at 9:44 pm to Dr. Clarene Duke, Who verbally acknowledged these results. Electronically Signed   By: Elsie Stain M.D.   On: 03/14/2018 21:52   Mr Brain Wo Contrast  Result Date: 03/15/2018 CLINICAL DATA:  57 y/o M; sudden onset of right upper extremity numbness and weakness. EXAM: MRI HEAD WITHOUT CONTRAST TECHNIQUE: Multiplanar, multiecho pulse sequences of the brain and surrounding structures were obtained without intravenous contrast. COMPARISON:  03/14/2018 CT head and CTA head. FINDINGS: Brain: Several foci of reduced diffusion are present throughout the left MCA distribution, predominantly posteriorly, compatible with acute/early subacute infarction. 2 punctate foci are also present within the left occipital lobe. No associated mass effect. There are several foci of susceptibility hypointensity scattered throughout the region of infarction compatible with petechial hemorrhage. No extra-axial collection, hydrocephalus, effacement of basilar cisterns, or herniation. Mild volume loss of the brain. Vascular: Normal flow voids. Skull and upper cervical spine: Normal marrow signal. Sinuses/Orbits: Mild diffuse paranasal sinus mucosal thickening and large maxillary sinus mucous retention cyst. Aerosolized secretions within the right maxillary sinus may represent acute sinusitis. No abnormal signal of the mastoid air cells. Other: None. IMPRESSION: 1. Several foci of acute/early subacute infarction are present throughout the left MCA distribution predominantly posteriorly. Positive for petechial hemorrhage. No mass effect. 2. Mild volume loss of the brain. 3. Paranasal sinus disease with right maxillary aerosolized secretions which may represent acute sinusitis. These results will be called to the ordering clinician or representative by the Radiologist Assistant, and communication documented in the PACS or zVision Dashboard.  Electronically Signed   By: Mitzi Hansen M.D.   On: 03/15/2018 23:02   Ct Head Code Stroke Wo Contrast  Result Date: 03/14/2018 CLINICAL DATA:  Code stroke. RIGHT-sided numbness for 3 hours. RIGHT upper extremity weakness. EXAM: CT HEAD WITHOUT CONTRAST TECHNIQUE: Contiguous axial images were obtained from the base of the skull through the vertex without intravenous contrast. COMPARISON:  None. FINDINGS: Brain: No evidence for acute infarction, hemorrhage, mass lesion, hydrocephalus, or extra-axial fluid. Mild premature cerebral and cerebellar atrophy, with ventricular prominence particularly of the fourth ventricle reflecting vermian brain substance loss. Vascular: No hyperdense vessel or unexpected calcification. Skull: Normal. Negative for fracture or focal lesion. Scalp sebaceous cysts. Sinuses/Orbits: Significant opacity of the RIGHT maxillary sinus. Small retention cyst in the LEFT maxillary sinus. Frontal sinuses hypoplastic. Ethmoid sphenoid sinuses unremarkable. Dense lenticular opacities. Other: None. ASPECTS [facility] Stroke Program Early CT Score) - Ganglionic level infarction (caudate, lentiform nuclei, internal capsule, insula, M1-M3 cortex): 7 - Supraganglionic infarction (M4-M6 cortex): 3 Total score (0-10 with 10 being normal): 10 IMPRESSION: 1. Atrophy.  No evidence of acute stroke. 2. ASPECTS is 10. These results were called by telephone at the time of interpretation on 03/14/2018 at 7:36 pm to Dr. Samuel Jester , who verbally acknowledged these results. Electronically Signed   By: Elsie Stain M.D.   On: 03/14/2018 19:42        Scheduled  Meds: . atorvastatin  80 mg Oral q1800  . insulin aspart  0-15 Units Subcutaneous TID WC  . insulin aspart  0-5 Units Subcutaneous QHS  . insulin glargine  10 Units Subcutaneous Daily  . pneumococcal 23 valent vaccine  0.5 mL Intramuscular Tomorrow-1000   Continuous Infusions: . sodium chloride 10 mL/hr at 03/15/18 1500  .  heparin 1,200 Units/hr (03/16/18 0059)     LOS: 1 day     Marcellus Scott, MD, FACP, [facility]. [facility] Pager [phone]  If 7PM-7AM, please contact night-coverage [email or web address] Password [facility] 03/16/2018, 7:36 AM

## 2018-03-16 NOTE — Progress Notes (Signed)
ANTICOAGULATION CONSULT NOTE - Follow Up Consult  Pharmacy Consult for heparin Indication: stroke/carotid dissection  Allergies  Allergen Reactions  . Penicillins Anaphylaxis  . Aspirin   . Lisinopril     REACTION: Questionable angioedema  to lisinopril    Patient Measurements: Height: 5\' 6"  (167.6 cm) Weight: 165 lb (74.8 kg) IBW/kg (Calculated) : 63.8 Heparin Dosing Weight: 75kg  Vital Signs: Temp: 98.1 F (36.7 C) (09/23 0745) Temp Source: Oral (09/23 0745) BP: 152/75 (09/23 0745) Pulse Rate: 88 (09/23 0745)  Labs: Recent Labs    03/14/18 1912 03/14/18 1921 03/15/18 0428 03/16/18 0002 03/16/18 0755  HGB 12.5* 13.9 12.1* 11.8*  --   HCT 38.8* 41.0 40.4 39.6  --   PLT 339  --  332 349  --   APTT 26  --   --   --   --   LABPROT 13.0  --   --   --   --   INR 0.99  --   --   --   --   HEPARINUNFRC  --   --   --  0.17* 0.53  CREATININE 0.90 0.80 0.73  --   --     Estimated Creatinine Clearance: 91.9 mL/min (by C-G formula based on SCr of 0.73 mg/dL).   Medications:  Medications Prior to Admission  Medication Sig Dispense Refill Last Dose  . glyBURIDE (DIABETA) 5 MG tablet Take 5 mg by mouth daily with breakfast.   Past Month at Unknown time  . metFORMIN (GLUCOPHAGE) 1000 MG tablet Take 1 tablet (1,000 mg total) by mouth 2 (two) times daily with a meal. 60 tablet  Past Month at Unknown time  . glucose blood (ACCU-CHEK AVIVA) test strip Use as instructed 100 each  Not Taking   Scheduled:  . atorvastatin  80 mg Oral q1800  . insulin aspart  0-15 Units Subcutaneous TID WC  . insulin aspart  0-5 Units Subcutaneous QHS  . insulin glargine  10 Units Subcutaneous Daily  . pneumococcal 23 valent vaccine  0.5 mL Intramuscular Tomorrow-1000   Infusions:  . sodium chloride 10 mL/hr at 03/15/18 1500  . heparin 1,200 Units/hr (03/16/18 0059)    Assessment: Rodney Mcgee MaduroRobert was recently admitted for right arm weakness. His CT showed carotid dissection that led to CVA. Neurology  is recommending low dose anticoagulation with IV heparin then transition to coumadin.   Heparin level slightly supratherapeutic at 0.53 for lower goal on 1200 units/hr, Hgb low but stable, no bleeding reported at this time.  Goal of Therapy:  Heparin level 0.3-0.5 units/ml Monitor platelets by anticoagulation protocol: Yes   Plan:  Decrease heparin gtt to 1150 units/hr Daily heparin level, CBC, s/s bleeding F/u transition to coumadin  Daylene PoseyJonathan Jennetta Flood, PharmD Clinical Pharmacist Please check AMION for all [facility] numbers 03/16/2018 9:07 AM

## 2018-03-16 NOTE — Progress Notes (Signed)
ANTICOAGULATION CONSULT NOTE - Follow Up Consult  Pharmacy Consult for heparin Indication: stroke and carotid dissection  Labs: Recent Labs    03/14/18 1912 03/14/18 1921 03/15/18 0428 03/16/18 0002  HGB 12.5* 13.9 12.1* 11.8*  HCT 38.8* 41.0 40.4 39.6  PLT 339  --  332 349  APTT 26  --   --   --   LABPROT 13.0  --   --   --   INR 0.99  --   --   --   HEPARINUNFRC  --   --   --  0.17*  CREATININE 0.90 0.80 0.73  --     Assessment: 57yo male subtherapeutic on heparin with initial dosing for carotid dissection leading to CVA; RN notes some oozing around IV site but no overt bleeding.  Goal of Therapy:  Heparin level 0.3-0.5 units/ml   Plan:  Will increase heparin gtt by 2 units/kg/hr to 1200 units/hr and check level in 6 hours.    Vernard GamblesVeronda Wilbon Obenchain, PharmD, BCPS  03/16/2018,12:53 AM

## 2018-03-16 NOTE — Progress Notes (Signed)
  Echocardiogram 2D Echocardiogram has been performed.  Rynn Markiewicz L Androw 03/16/2018, 10:10 AM

## 2018-03-16 NOTE — Progress Notes (Addendum)
Neurology Progress Note Reason for Consult: Stroke Referring Physician: [place], G  CC: Stroke  History is obtained from: Patient  HPI: Benson Porcaro Creely is a 57 y.o. male with a history of diabetes, hypertension, hyperlipidemia who presents with right arm weakness that started early in the day on 9/22. He was evaluated by tele-neurology at an out side hospital, who felt that though he had had a stroke, but not a TPA candidate since he was outside window to tx. CTA demonstrated carotid dissection with intramural hematoma. He does not know of any recent neck trauma, nor does he have any neck pain.  Interval History: Heparin gtt started. Still with right arm/face numbness, but improved weakness  ROS: A 14 point ROS was performed and is negative except as noted in the HPI.  Unable to obtain due to altered mental status.   Exam: Current vital signs: BP 128/76 (BP Location: Left Arm)   Pulse 88   Temp 98 F (36.7 C) (Oral)   Resp 18   Ht 5\' 6"  (1.676 m)   Wt 74.8 kg   SpO2 99%   BMI 26.63 kg/m  Vital signs in last 24 hours: Temp:  [97.5 F (36.4 C)-98.1 F (36.7 C)] 98 F (36.7 C) (09/23 1145) Pulse Rate:  [88-92] 88 (09/23 1145) Resp:  [18] 18 (09/23 1145) BP: (128-152)/(68-79) 128/76 (09/23 1145) SpO2:  [97 %-100 %] 99 % (09/23 1145)   Physical Exam  Constitutional: Appears well-developed and well-nourished.  Psych: Affect appropriate to situation Eyes: No scleral injection HENT: No OP obstrucion Head: Normocephalic.  Cardiovascular: Normal rate and regular rhythm.  Respiratory: Effort normal, non-labored breathing GI: Soft.  No distension. There is no tenderness.  Skin: WDI  Neuro: Mental Status: Patient is awake, alert, oriented to person, place, month, year, and situation. Patient is able to give a clear and coherent history. No signs of aphasia or neglect Cranial Nerves: II: Visual Fields are full. Pupils are equal, round, and reactive to light.   III,IV, VI: EOMI  without ptosis or diploplia.  V: Facial sensation is noted to be diminished on right VII: Facial movement is with right facial weakness VIII: hearing is intact to voice X: Uvula elevates symmetrically XI: Shoulder shrug is symmetric. XII: tongue is midline without atrophy or fasciculations.  Motor: He has 4-/5 proximal, 3/5 distal strength in the right upper extremity, 5/5 in the right lower extremity as well as throughout the left side. 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  Given intrarenal hematoma, I do think I would favor anticoagulation as opposed to antiplatelet therapy over the long-term for him.  ASSESSMENT/PLAN Mr. Rishon Thilges Pettway is a 57 y.o. Male presenting with Right side weakness/numbness. There is scattered L MCA infarct, likely due to the Lt carotid dissection seen on CTA. Now started on Heparin gtt   Stroke:  CTA head/neck: Lt carotid dissection with hematoma  MRI head - Scattered Lt MCA infarcts  2D Echo - EF 60-65%  LDL 150  HgbA1c -10.9   VTE prophylaxis - on full anticoagulation at this time  Diet per  SLP  No antiplt/anticoag prior to admission, now on Heparin gtt, with plan to transition to Coumadin for at lest 3 mo  Ongoing aggressive stroke risk factor management  Therapy recommendations: outpt PT/OT  Disposition:  Pending  Hypertension  Stable . Long-term BP goal normotensive  Hyperlipidemia  Lipid lowering medication PTA:  none  LDL 150, goal < 70  Current lipid lowering medication:Lipitor  Continue statin  at discharge  Diabetes  HgbA1c 10.9, goal < 7.0  Uncontrolled; Hyperglycemia during stay  Other Stroke Risk Factors  Cigarette smoker; advised to stop smoking  ETOH use, advised to drink no more than 1-2 drink(s) a day  Hospital day # 1 - Start Coumadin, goal INR 2-3 - Recommend transitioning off Heparin gtt, may use Lovenox to bridge for ease of d/c planning - repeat CTA in 3 months, before d/c coumadin as out pt - Stroke education done with pt on his risk factors and secondary prevention - Out pt neuro f/u in 6-8 weeks.  - We will sign off. Please call back if needed.   Desiree Metzger-Cihelka, ARNP-C To contact Stroke Continuity provider, please refer to [email or web address]. After hours, contact General Neurology  ATTENDING NOTE: I reviewed above note and agree with the assessment and plan. Pt was seen and examined.   Patient neurological stable.  No complaints, no acute event overnight.  MRI showed scattered left MCA infarcts.  Currently on heparin IV.  Recommend to switch to Coumadin INR goal 2-3 with Lovenox bridge.  Repeat CTA neck in 2-3 months, if carotid thrombus resolved, Coumadin can be discontinued and switched to aspirin 81.  Continue statin for hyperlipidemia and stroke prevention.  Aggressive risk factor modification including diabetes control and quit smoking.  Educated on medication compliance.  PT/OT recommend outpatient therapy.  Neurology will sign off. Please call with questions. [facility] weeks. Thanks for the consult.   Marvel PlanJindong Jaeley Wiker, MD PhD Stroke Neurology 03/16/2018 7:49 PM

## 2018-03-16 NOTE — Progress Notes (Signed)
Physical Therapy Treatment Patient Details Name: Benoit Meech Persons MRN: [id number] DOB: [birth year] Today's Date: 03/16/2018    History of Present Illness  Rodney Mcgee  is a 57 y.o. male, with history of diabetes mellitus, hypertension, hyperlipidemia, tobacco abuse came to hospital with right hand numbness.  Patient says that he woke up after a nap and noticed numbness of the right upper extremity especially right hand.  He was unable to hold anything with the right hand.  He denies any weakness or numbness in the leg. 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  Subtle hypoattenuation of left frontal gyrus which could represent acute infarction.    PT Comments    Patient seen for mobility progression. Pt c/o dizziness in sitting and standing.Overall pt is at mod I/supervision level for OOB mobility and with DGI score of 21/24. Pt continues to have right UE numbness/weakness. Current plan remains appropriate.     Follow Up Recommendations  Other (comment)(Outpatient OT/Hand therapy)     Equipment Recommendations  None recommended by PT    Recommendations for Other Services OT consult(as ordered)     Precautions / Restrictions      Mobility  Bed Mobility Overal bed mobility: Independent                Transfers Overall transfer level: Independent Equipment used: None                Ambulation/Gait Ambulation/Gait assistance: Modified independent (Device/Increase time)   Assistive device: None Gait Pattern/deviations: Step-through pattern     General Gait Details: slow, steady gait; cues for increased cadence   Stairs Stairs: Yes Stairs assistance: Supervision Stair Management: Forwards;One rail Left;Alternating pattern   General stair comments: cues for safety and slowing down especially when descending    Wheelchair  Mobility    Modified Rankin (Stroke Patients Only) Modified Rankin (Stroke Patients Only) Pre-Morbid Rankin Score: No symptoms Modified Rankin: Slight disability     Balance                                 Standardized Balance Assessment Standardized Balance Assessment : Dynamic Gait Index   Dynamic Gait Index Level Surface: Normal Change in Gait Speed: Mild Impairment Gait with Horizontal Head Turns: Normal Gait with Vertical Head Turns: Mild Impairment Gait and Pivot Turn: Normal Step Over Obstacle: Normal Step Around Obstacles: Normal Steps: Mild Impairment Total Score: 21      Cognition Arousal/Alertness: Awake/alert Behavior During Therapy: WFL for tasks assessed/performed Overall Cognitive Status: Within Functional Limits for tasks assessed                                        Exercises      General Comments        Pertinent Vitals/Pain Pain Assessment: No/denies pain    Home Living                      Prior Function            PT Goals (current goals can now be found in the care plan section) Acute Rehab PT Goals Patient Stated Goal: Hopes for R hand to get better for return to work  PT Goal Formulation: With patient Time For Goal Achievement: 03/22/18 Potential to Achieve Goals: Good Progress towards PT goals: Progressing toward goals    Frequency    Min 4X/week      PT Plan Current plan remains appropriate    Co-evaluation              AM-PAC PT "6 Clicks" Daily Activity  Outcome Measure  Difficulty turning over in bed (including adjusting bedclothes, sheets and blankets)?: A Little Difficulty moving from lying on back to sitting on the side of the bed? : A Little Difficulty sitting down on and standing up from a chair with arms (e.g., wheelchair, bedside commode, etc,.)?: None Help needed moving to and from a bed to chair (including a wheelchair)?: None Help needed walking in hospital  room?: None Help needed climbing 3-5 steps with a railing? : None 6 Click Score: 22    End of Session Equipment Utilized During Treatment: Gait belt Activity Tolerance: Patient tolerated treatment well Patient left: with call bell/phone within reach;in bed Nurse Communication: Mobility status PT Visit Diagnosis: Unsteadiness on feet (R26.81);Other abnormalities of gait and mobility (R26.89)     Time: [id number] PT Time Calculation (min) (ACUTE ONLY): 13 min  Charges:  $Gait Training: 8-22 mins                     Erline LevineKellyn Abdurrahman Petersheim, PTA Acute Rehabilitation Services Pager: [phone] Office: [phone]     Carolynne EdouardKellyn R Janijah Symons 03/16/2018, 12:37 PM

## 2018-03-16 NOTE — Evaluation (Signed)
Speech Language Pathology Evaluation Patient Details Name: Rodney CheadleRobert L Carvell MRN: [id number] DOB: 01/12/1961 Today's Date: 03/16/2018 Time: [id number] SLP Time Calculation (min) (ACUTE ONLY): 17 min  Problem List:  Patient Active Problem List   Diagnosis Date Noted  . Stroke (cerebrum) (HCC) 03/15/2018  . Preventative health care 03/13/2012  . Erectile dysfunction associated with type 2 diabetes mellitus (HCC) 10/01/2010  . Allergic rhinitis, seasonal 10/01/2010  . CRACKED TOOTH 12/07/2008  . Nonproliferative diabetic retinopathy HYQ(657.84OS(362.03) 07/22/2007  . Hyperlipidemia LDL goal <100 12/31/2006  . PAIN IN JOINT, SHOULDER 07/22/2006  . History of tobacco abuse 06/16/2006  . Essential hypertension 06/03/2006  . Type 2 diabetes mellitus, uncontrolled (HCC) 06/03/1994   Past Medical History:  Past Medical History:  Diagnosis Date  . Diabetes mellitus without complication (HCC)   . Hyperlipidemia   . Hypertension   . Tobacco abuse    Past Surgical History: History reviewed. No pertinent surgical history. HPI:   Rodney MaduroRobert Mcgee  is a 57 y.o. male, with history of diabetes mellitus, hypertension, hyperlipidemia, tobacco abuse came to hospital with right hand numbness.  Patient says that he woke up after a nap and noticed numbness of the right upper extremity especially right hand.  He was unable to hold anything with the right hand.  He denies any weakness or numbness in the leg. 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  Subtle hypoattenuation of left frontal gyrus which could represent acute infarction.   Assessment / Plan / Recommendation Clinical Impression  Patient's is at baseline for cognition and language skills. No observed deficits. No speech and therapy recommended. speech therapy to sign off.    SLP Assessment  SLP  Recommendation/Assessment: Patient does not need any further Speech Lanaguage Pathology Services SLP Visit Diagnosis: Cognitive communication deficit (R41.841)                     SLP Evaluation Cognition  Overall Cognitive Status: Within Functional Limits for tasks assessed Arousal/Alertness: Awake/alert Orientation Level: Oriented X4 Attention: Focused Focused Attention: Appears intact Memory: Appears intact Awareness: Appears intact Problem Solving: Appears intact Executive Function: Reasoning Reasoning: Appears intact Safety/Judgment: Appears intact       Comprehension  Auditory Comprehension Overall Auditory Comprehension: Appears within functional limits for tasks assessed Yes/No Questions: Within Functional Limits Commands: Within Functional Limits Conversation: Simple Reading Comprehension Reading Status: Not tested    Expression Expression Primary Mode of Expression: Verbal Verbal Expression Overall Verbal Expression: Appears within functional limits for tasks assessed Initiation: No impairment Level of Generative/Spontaneous Verbalization: Conversation Repetition: No impairment Naming: No impairment Pragmatics: No impairment Written Expression Dominant Hand: Right Written Expression: Within Functional Limits   Oral / Motor  Oral Motor/Sensory Function Overall Oral Motor/Sensory Function: Within functional limits Motor Speech Overall Motor Speech: Appears within functional limits for tasks assessed Respiration: Within functional limits Phonation: Normal Resonance: Within functional limits Articulation: Within functional limitis Intelligibility: Intelligible Motor Planning: Witnin functional limits Motor Speech Errors: Not applicable   GO                    Lindalou HoseSarah J. Alexandre Lightsey, MA, CCC-SLP 03/16/2018 1:34 PM

## 2018-03-16 NOTE — Care Management Note (Signed)
Case Management Note  Patient Details  Name: Doris CheadleRobert L Sabree MRN: [id number] Date of Birth: 02/09/1961  Subjective/Objective:    Pt admitted with a stroke. Pt without PCP and insurance. [place] and was up here visiting his children. Pt [place] amount of [place] and would like a PCP in the [place] area.  CM placed two clinics on the AVS he can use for low cost care and medication assistance.  Pt informed [place] would have [place] [facility] [place] back.                 Action/Plan: CM following for medication assistance. Pt will most likely required a MATCH letter at d/c. CM following.   Expected Discharge Date:                  Expected Discharge Plan:  OP Rehab  In-House Referral:     Discharge planning Services  CM Consult, Indigent Health Clinic, Medication Assistance  Post Acute Care Choice:    Choice offered to:     DME Arranged:    DME Agency:     HH Arranged:    HH Agency:     Status of Service:  In process, will continue to follow  If discussed at Long Length of Stay Meetings, dates discussed:    Additional Comments:  Kermit BaloKelli F Constanza Mincy, RN 03/16/2018, 12:06 PM

## 2018-03-16 NOTE — Progress Notes (Signed)
ANTICOAGULATION CONSULT NOTE - Follow Up Consult  Pharmacy Consult for heparin Indication: stroke likely secondary to carotid dissection  Allergies  Allergen Reactions  . Penicillins Anaphylaxis  . Aspirin   . Lisinopril     REACTION: Questionable angioedema  to lisinopril    Patient Measurements: Height: 5\' 6"  (167.6 cm) Weight: 165 lb (74.8 kg) IBW/kg (Calculated) : 63.8 Heparin Dosing Weight: 75kg  Vital Signs: Temp: 97.9 F (36.6 C) (09/23 1633) Temp Source: Oral (09/23 1633) BP: 157/84 (09/23 1633) Pulse Rate: 86 (09/23 1633)  Labs: Recent Labs    03/14/18 1912 03/14/18 1921 03/15/18 0428 03/16/18 0002 03/16/18 0755  HGB 12.5* 13.9 12.1* 11.8*  --   HCT 38.8* 41.0 40.4 39.6  --   PLT 339  --  332 349  --   APTT 26  --   --   --   --   LABPROT 13.0  --   --   --   --   INR 0.99  --   --   --   --   HEPARINUNFRC  --   --   --  0.17* 0.53  CREATININE 0.90 0.80 0.73  --   --     Estimated Creatinine Clearance: 91.9 mL/min (by C-G formula based on SCr of 0.73 mg/dL).   Medications:  Medications Prior to Admission  Medication Sig Dispense Refill Last Dose  . glyBURIDE (DIABETA) 5 MG tablet Take 5 mg by mouth daily with breakfast.   Past Month at Unknown time  . metFORMIN (GLUCOPHAGE) 1000 MG tablet Take 1 tablet (1,000 mg total) by mouth 2 (two) times daily with a meal. 60 tablet  Past Month at Unknown time  . glucose blood (ACCU-CHEK AVIVA) test strip Use as instructed 100 each  Not Taking   Scheduled:  . atorvastatin  80 mg Oral q1800  . insulin aspart  0-15 Units Subcutaneous TID WC  . insulin aspart  0-5 Units Subcutaneous QHS  . insulin glargine  10 Units Subcutaneous Daily  . pneumococcal 23 valent vaccine  0.5 mL Intramuscular Tomorrow-1000   Infusions:  . sodium chloride 10 mL/hr at 03/15/18 1500  . heparin 1,150 Units/hr (03/16/18 0931)    Assessment: Rodney Mcgee was recently admitted for right arm weakness. His CT showed carotid dissection that  led to CVA. Neurology initially recommended low dose anticoagulation with IV heparin then transition to coumadin.  Heparin was started on 9/22.  Seen by neurology 9/23 who now recommends change to Lovenox >> Coumadin in order to facilitate discharge home.  Goal of Therapy:  INR 2-3 Monitor platelets by anticoagulation protocol: Yes   Plan:  Discontinue heparin Lovenox 75mg  SQ q12h - first dose 1 hour after heparin stopped. Coumadin 7.5mg  PO x 1 tonight Daily INR, s/s bleeding Coumadin education  Rodney Mcgee, [address]Ph[address]D., BCPS Clinical Pharmacist Pager: [phone] Clinical phone for 03/16/2018 is [phone].  **Pharmacist phone directory can now be found on [email or web address] ([email or web address]).  [facility]ed under [facility].  03/16/2018 6:50 PM

## 2018-03-16 NOTE — Progress Notes (Signed)
Inpatient Diabetes Program Recommendations  AACE/ADA: New Consensus Statement on Inpatient Glycemic Control (2015)  Target Ranges:  Prepandial:   less than 140 mg/dL      Peak postprandial:   less than 180 mg/dL (1-2 hours)      Critically ill patients:  140 - 180 mg/dL   Lab Results  Component Value Date   GLUCAP 167 (H) 03/16/2018   HGBA1C 10.9 (H) 03/15/2018    Review of Glycemic Control Results for Rodney Mcgee, Rodney Mcgee (MRN [id number]) as of 03/16/2018 14:45  Ref. Range 03/15/2018 17:41 03/15/2018 23:30 03/16/2018 08:51 03/16/2018 11:43  Glucose-Capillary Latest Ref Range: 70 - 99 mg/dL 214 (H) 158 (H) 305 (H) 167 (H)   Diabetes history: Type 2 DM Outpatient Diabetes medications: Glyburide 5 mg QAM, Metformin 1000 mg BID Current orders for Inpatient glycemic control: Lantus 10 units QD, Novolog 0-15 units TID, Novolog 0-5 units QAM  Inpatient Diabetes Program Recommendations:    Would recommend increasing Lantus 16 units QD.   Would anticipate Novolin 70/30 at discharge, given lack of insurance and A1C level.   Noted consult. Spoke with patient regarding outpatient diabetes management. Patient states, " I haven't been taking my oral medications in about 3 months and I haven't been checking blood sugars."   Reviewed patient's current A1c of 10.9%. Explained what a A1c is and what it measures. Also reviewed goal A1c with patient, importance of good glucose control @ home, and blood sugar goals. Reviewed patho of DM, role of pancreas, vascular changes that occur, and co-morbidies.   Educated patient on insulin pen use at home. Reviewed contents of insulin flexpen starter kit. Reviewed all steps if insulin pen including attachment of needle, 2-unit air shot, dialing up dose, giving injection, removing needle, disposal of sharps, storage of unused insulin, disposal of insulin etc. Patient unable to provide successful return demonstration. Will attempt again in the AM. Also reviewed  troubleshooting with insulin pen. MD to give patient Rxs for insulin pens and insulin pen needles.   Thanks, Bronson Curb, MSN, RNC-OB Diabetes Coordinator [phone] (8a-5p)

## 2018-03-17 LAB — GLUCOSE, CAPILLARY
GLUCOSE-CAPILLARY: 162 mg/dL — AB (ref 70–99)
Glucose-Capillary: 213 mg/dL — ABNORMAL HIGH (ref 70–99)
Glucose-Capillary: 212 mg/dL — ABNORMAL HIGH (ref 70–99)
Glucose-Capillary: 245 mg/dL — ABNORMAL HIGH (ref 70–99)

## 2018-03-17 LAB — CBC
HCT: 41.1 % (ref 39.0–52.0)
HEMOGLOBIN: 12.3 g/dL — AB (ref 13.0–17.0)
MCH: 21.5 pg — AB (ref 26.0–34.0)
MCHC: 29.9 g/dL — ABNORMAL LOW (ref 30.0–36.0)
MCV: 72 fL — AB (ref 78.0–100.0)
PLATELETS: 329 10*3/uL (ref 150–400)
RBC: 5.71 MIL/uL (ref 4.22–5.81)
RDW: 13.3 % (ref 11.5–15.5)
WBC: 6.5 10*3/uL (ref 4.0–10.5)

## 2018-03-17 LAB — PROTIME-INR
INR: 1.02
PROTHROMBIN TIME: 13.3 s (ref 11.4–15.2)

## 2018-03-17 MED ORDER — INSULIN ASPART 100 UNIT/ML ~~LOC~~ SOLN
0.0000 [IU] | Freq: Three times a day (TID) | SUBCUTANEOUS | Status: DC
  Administered 2018-03-18: 2 [IU] via SUBCUTANEOUS
  Administered 2018-03-18: 3 [IU] via SUBCUTANEOUS
  Administered 2018-03-19 (×3): 2 [IU] via SUBCUTANEOUS
  Administered 2018-03-20: 1 [IU] via SUBCUTANEOUS
  Administered 2018-03-20 (×2): 2 [IU] via SUBCUTANEOUS
  Administered 2018-03-21: 1 [IU] via SUBCUTANEOUS
  Administered 2018-03-21: 2 [IU] via SUBCUTANEOUS

## 2018-03-17 MED ORDER — WARFARIN SODIUM 7.5 MG PO TABS
7.5000 mg | ORAL_TABLET | Freq: Once | ORAL | Status: AC
  Administered 2018-03-17: 7.5 mg via ORAL
  Filled 2018-03-17: qty 1

## 2018-03-17 MED ORDER — INSULIN ASPART PROT & ASPART (70-30 MIX) 100 UNIT/ML ~~LOC~~ SUSP
10.0000 [IU] | Freq: Two times a day (BID) | SUBCUTANEOUS | Status: DC
  Administered 2018-03-18 – 2018-03-21 (×7): 10 [IU] via SUBCUTANEOUS
  Filled 2018-03-17 (×2): qty 10

## 2018-03-17 MED ORDER — INSULIN ASPART 100 UNIT/ML ~~LOC~~ SOLN
0.0000 [IU] | Freq: Every day | SUBCUTANEOUS | Status: DC
  Administered 2018-03-17: 2 [IU] via SUBCUTANEOUS

## 2018-03-17 NOTE — Progress Notes (Signed)
Inpatient Diabetes Program Recommendations  AACE/ADA: New Consensus Statement on Inpatient Glycemic Control (2015)  Target Ranges:  Prepandial:   less than 140 mg/dL      Peak postprandial:   less than 180 mg/dL (1-2 hours)      Critically ill patients:  140 - 180 mg/dL   Lab Results  Component Value Date   GLUCAP 212 (H) 03/17/2018   HGBA1C 10.9 (H) 03/15/2018    Review of Glycemic Control Results for Rodney Mcgee, Rodney Mcgee (MRN [id number]) as of 03/17/2018 15:03  Ref. Range 03/16/2018 16:31 03/16/2018 21:23 03/17/2018 06:33 03/17/2018 11:32  Glucose-Capillary Latest Ref Range: 70 - 99 mg/dL 164 (H) 225 (H) 213 (H) 212 (H)   Diabetes history: Type 2 DM Outpatient Diabetes medications: Glyburide 5 mg QAM, Metformin 1000 mg BID Current orders for Inpatient glycemic control: Lantus 10 units QD, Novolog 0-15 units TID, Novolog 0-5 units QAM  Inpatient Diabetes Program Recommendations:    Would recommend increasing Lantus 16 units QD.   Would anticipate Novolin 70/30 at discharge, given lack of insurance and A1C level. Recommending 10 units BID of 70/30. Reviewed items to purchase through [employer] [facility] at [facility] to include insulin, pen needles, meter, lancets and strips.   Discussed survival skills, and interventions associated. Patient able to verbalize and understands to take insulin with meals. Reviewed and provided additional support to patient on insulin pen use at home. Reviewed contents of insulin flexpen starter kit. Reviewed all steps if insulin pen including attachment of needle, 2-unit air shot, dialing up dose, giving injection, removing needle, disposal of sharps, storage of unused insulin, disposal of insulin etc. Patient able to provide successful return demonstration and has been working with nursing staff on self injections. Also reviewed troubleshooting with insulin pen.   At this time, patient has no further questions regarding DM.   Thanks, Bronson Curb, MSN,  RNC-OB Diabetes Coordinator [phone] (8a-5p)

## 2018-03-17 NOTE — Plan of Care (Signed)
RD consulted for diet education related to diabetes.   Provided patient with handout "Carbohydrate Counting for People with Diabetes" from the Nutrition Care Manual. The following topics were covered: importance of portion size, fiber, limiting sugary beverages and desserts. Teach-back method was used.  Expect moderate level of compliance. Patient has received diabetes education before and states his major barrier to diabetes [place]-management is lack of insurance and funds to purchase medications, insulin, test strips, and related items.   Patient is on Heart Healthy/Carb-Modified diet and is eating 100% of meals. No nutrition interventions are warranted at this time. If nutritional issues arise, please re-consult RD as needed.   Evangelos Paulino, Dietetic Intern [phone]

## 2018-03-17 NOTE — Progress Notes (Addendum)
PROGRESS NOTE   Rodney CheadleRobert L Mcgee  ZOX:[id number]    DOB: 10/08/1960    DOA: 03/14/2018  PCP: Patient, No Pcp Per   I have briefly reviewed patients previous medical records in [facility].  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 Stroke team consulted, work-up completed and signed off 9/23.   Current barrier to discharge is that patient is from [place], he does not have family or friends locally that he can stay with and plans to return to [place] where he does not have a PCP to follow-up PT/INR while he is on Lovenox bridge to Coumadin.  Thereby patient will remain in the hospital until INR therapeutic >2.  He will still need PCP in [place] to follow-up PT and INR after discharge.   Assessment & Plan:   Active Problems:   Stroke (cerebrum) (HCC)   Acute/subacute left MCA embolic stroke: Resultant right upper extremity weakness.  Etiology: Embolic from left CCA dissection.  CT head 9/21: No evidence of acute stroke.  MRI brain 03/15/2018: Several foci of acute/early subacute infarction throughout the left MCA distribution.  Positive for petechial hemorrhage.  CTA head 9/21: Subtle hypoattenuation of the left frontal gyrus which could represent acute stroke.  CTA neck: Long segment left CCA dissection, extending over approximately 4 cm, with intraluminal  thrombus, estimated 75% stenosis.  LDL 150.  A1c: 10.9.  2D echo: LVEF 60-65% and grade 1 diastolic dysfunction.  Therapies recommend outpatient OT.  Neurology/stroke service consulted, [facility].  As per stroke MD sign off 9/23, transitioned from IV heparin drip to Lovenox bridge until INR therapeutic on Coumadin (INR 2-3), repeat CTA neck in 2 to 3 months and if carotid thrombus resolved then Coumadin may be discontinued and switched to aspirin 81 mg daily, continue statins for hyperlipidemia and stroke prevention, aggressive risk factor modification including better DM control and smoking cessation.  Neurology recommends follow-up in stroke clinic in 4 weeks.  However since patient will be returning to [place], he should follow-up with a local Neurologist.  He will need a local PCP there to coordinate his care.  Current barrier to discharge is that patient is from [place], he does not have family or friends locally that he can stay with and plans to return to [place] where he does not have a PCP to follow-up PT/INR while he is on Lovenox bridge to Coumadin.  Thereby patient will remain in the hospital until INR therapeutic >2.  He will still need PCP in [place] to follow-up PT and INR after discharge.  INR 1.02.  I discussed extensively with case management.  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  As per neurology, 3 months anticoagulation.  Now on Lovenox bridge and Coumadin.  Uncontrolled DM 2: A1c 10.9.  Oral hypoglycemics held.  Poorly controlled on SSI alone.  Started Lantus 10  units daily.  Discussed with patient that DC home on insulin is better DM control given above vascular complications.  He verbalized understanding.  He does not have insurance and as per diabetes coordinator, better to switch him to 70/30 due to cost issues.  Patient has received Lantus this morning.  Will change over to 70/30, 10  units twice daily beginning 9/25.  Adjust insulins as needed.  Discussed with RN regarding patient education for insulin administration.  Diabetes coordinator input appreciated.  Essential hypertension: Allowing permissive hypertension due to acute stroke.  Not on antihypertensives PTA.  Hyperlipidemia: LDL 150.  Atorvastatin 80 mg daily.  Microcytic anemia: Unclear etiology.  Follow CBC daily.    DVT prophylaxis: Currently on IV heparin infusion. Code Status: Full Family Communication: None at bed Disposition: DC home pending INR therapeutic >2.   Consultants:  Neurology  Procedures:  None  Antimicrobials:  None   Subjective: Indicates that his right upper extremity strength is slowly improving but states that it is only 30% better since admission.  ROS: As above, otherwise negative.  Objective:  Vitals:   03/17/18 0358 03/17/18 0819 03/17/18 1135 03/17/18 1537  BP: 130/74 118/79 117/71 (!) 152/85  Pulse: 84 84 87 90  Resp: 18 16 16 16   Temp: (!) 97.5 F (36.4 C) 97.6 F (36.4 C) 97.9 F (36.6 C) (!) 97.5 F (36.4 C)  TempSrc: Oral Oral Oral Oral  SpO2: 98% 96% 99% 100%  Weight:      Height:        Examination: No significant change in clinical exam compared to yesterday.  General exam: Pleasant young male, moderately built and nourished, lying comfortably supine in bed. Respiratory system: Clear to auscultation. Respiratory effort normal.  Stable Cardiovascular system: S1 & S2 heard, RRR. No JVD, murmurs, rubs, gallops or clicks. No pedal edema.  Telemetry personally reviewed: Sinus rhythm. Gastrointestinal system: Abdomen is nondistended, soft and nontender. No organomegaly or masses felt. Normal bowel sounds heard.  Stable Central nervous system: Alert and oriented. No cranial nerve deficits.  Right upper extremity grade 4 x 5 power Neck: No carotid bruit appreciated. Extremities: Symmetric 5 x 5 power. Skin: No rashes, lesions or ulcers Psychiatry:  Judgement and insight appear normal. Mood & affect appropriate.     Data Reviewed: I have personally reviewed following labs and imaging studies  CBC: Recent Labs  Lab 03/14/18 1912 03/14/18 1921 03/15/18 0428 03/16/18 0002 03/17/18 0612  WBC 8.4  --  7.8 8.8 6.5  NEUTROABS 4.9  --   --   --   --   HGB 12.5* 13.9 12.1* 11.8* 12.3*  HCT 38.8* 41.0 40.4 39.6 41.1  MCV 70.9*  --  72.3* 72.1* 72.0*  PLT 339  --  332 349 329   Basic Metabolic Panel: Recent Labs  Lab 03/14/18 1912 03/14/18 1921 03/15/18 0428  NA 134* 135  --   K 4.7 4.7  --   CL 102 101  --   CO2 24  --   --   GLUCOSE 328* 324*  --   BUN 16 17  --   CREATININE 0.90 0.80 0.73  CALCIUM 9.1  --   --    Liver Function Tests: Recent Labs  Lab 03/14/18 1912  AST 21  ALT 23  ALKPHOS 96  BILITOT 0.4  PROT 7.7  ALBUMIN 3.7   Coagulation Profile: Recent Labs  Lab 03/14/18 1912 03/17/18 0612  INR 0.99 1.02   CBG: Recent Labs  Lab  03/16/18 1143 03/16/18 1631 03/16/18 2123 03/17/18 0633 03/17/18 1132  GLUCAP 167* 164* 225* 213* 212*    No results found for this or any previous visit (from the past 240 hour(s)).       Radiology Studies: Mr Brain 49 Contrast  Result Date: 03/15/2018 CLINICAL DATA:  57 y/o M; sudden onset of right upper extremity numbness and weakness. EXAM: MRI HEAD WITHOUT CONTRAST TECHNIQUE: Multiplanar, multiecho pulse sequences of the brain and surrounding structures were obtained without intravenous contrast. COMPARISON:  03/14/2018 CT head and CTA head. FINDINGS: Brain: Several foci of reduced diffusion are present throughout the left MCA distribution, predominantly posteriorly, compatible with acute/early subacute infarction. 2 punctate foci are also present within the left occipital lobe. No associated mass effect. There are several foci of susceptibility hypointensity scattered throughout the region of infarction compatible with petechial hemorrhage. No extra-axial  collection, hydrocephalus, effacement of basilar cisterns, or herniation. Mild volume loss of the brain. Vascular: Normal flow voids. Skull and upper cervical spine: Normal marrow signal. Sinuses/Orbits: Mild diffuse paranasal sinus mucosal thickening and large maxillary sinus mucous retention cyst. Aerosolized secretions within the right maxillary sinus may represent acute sinusitis. No abnormal signal of the mastoid air cells. Other: None. IMPRESSION: 1. Several foci of acute/early subacute infarction are present throughout the left MCA distribution predominantly posteriorly. Positive for petechial hemorrhage. No mass effect. 2. Mild volume loss of the brain. 3. Paranasal sinus disease with right maxillary aerosolized secretions which may represent acute sinusitis. These results will be called to the ordering clinician or representative by the Radiologist Assistant, and communication documented in the PACS or zVision Dashboard. Electronically Signed   By: Mitzi Hansen M.D.   On: 03/15/2018 23:02        Scheduled Meds: . atorvastatin  80 mg Oral q1800  . enoxaparin (LOVENOX) injection  1 mg/kg Subcutaneous Q12H  . insulin aspart  0-15 Units Subcutaneous TID WC  . insulin aspart  0-5 Units Subcutaneous QHS  . insulin glargine  10 Units Subcutaneous Daily  . warfarin  7.5 mg Oral ONCE-1800  . Warfarin - Pharmacist Dosing Inpatient   Does not apply q1800   Continuous Infusions: . sodium chloride 10 mL/hr at 03/15/18 1500     LOS: 2 [place]     Marcellus Scott, MD, FACP, [facility] Pager [phone]  If 7PM-7AM, please contact night-coverage [email or web address] Password [email or web address] 03/17/2018, 5:00 PM

## 2018-03-17 NOTE — Progress Notes (Signed)
Occupational Therapy Treatment Patient Details Name: Terrace Fontanilla Waters MRN: [id number] DOB: December 12, 1960 Today's Date: 03/17/2018    History of present illness  Rocky Smola  is a 57 y.o. male, with history of diabetes mellitus, hypertension, hyperlipidemia, tobacco abuse came to hospital with right hand numbness.  Patient says that he woke up after a nap and noticed numbness of the right upper extremity especially right hand.  He was unable to hold anything with the right hand.  He denies any weakness or numbness in the leg. 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  Subtle hypoattenuation of left frontal gyrus which could represent acute infarction.   OT comments  Focus of session on teaching compensatory strategies for ADL and establishing RUE HEP as noted below. Pt will benefit from continued therapy in the outpt setting. Will continue to follow acutely.   Follow Up Recommendations  Outpatient OT;Supervision - Intermittent    Equipment Recommendations  None recommended by OT    Recommendations for Other Services      Precautions / Restrictions Precautions Precautions: None       Mobility Bed Mobility Overal bed mobility: Independent                Transfers                      Balance                                           ADL either performed or assessed with clinical judgement   ADL                       Lower Body Dressing: Minimal assistance(difficulty with donning socks; educated on 1 handed techniqu)    Issued tubing for use with utensils and toothbrush. Ableto complete hand to mouth pattern using RUE; uncoordinated but functinoal                   Vision       Perception     Praxis      Cognition Arousal/Alertness: Awake/alert Behavior During Therapy: WFL for  tasks assessed/performed Overall Cognitive Status: Within Functional Limits for tasks assessed                                          Exercises Exercises: Other exercises Other Exercises Other Exercises: yellow theraputty ex Other Exercises: fine motor/coordination handout; began reviewing exercises Other Exercises: cup stacking/ BUE integrated activities Other Exercises: D1 diagonals Other Exercises: weight bearing through extending arm   Shoulder Instructions       General Comments      Pertinent Vitals/ Pain       Pain Assessment: No/denies pain  Home Living                                          Prior Functioning/Environment              Frequency  Min 3X/week        Progress  Toward Goals  OT Goals(current goals can now be found in the care plan section)  Progress towards OT goals: Progressing toward goals  Acute Rehab OT Goals Patient Stated Goal: Hopes for R hand to get better for return to work OT Goal Formulation: With patient Time For Goal Achievement: 03/29/18 Potential to Achieve Goals: Good ADL Goals Pt Will Perform Eating: with modified independence;sitting;with adaptive utensils Pt Will Perform Grooming: with modified independence;standing Pt Will Perform Upper Body Dressing: with modified independence;sitting Pt Will Perform Lower Body Dressing: with modified independence;sit to/from stand Pt Will Transfer to Toilet: with modified independence;ambulating;regular height toilet Pt Will Perform Toileting - Clothing Manipulation and hygiene: with modified independence;sit to/from stand Pt Will Perform Tub/Shower Transfer: Tub transfer;ambulating;with modified independence Pt/caregiver will Perform Home Exercise Program: Increased strength;Right Upper extremity;With theraband;With theraputty;With written HEP provided  Plan Discharge plan remains appropriate    Co-evaluation                 AM-PAC PT  "6 Clicks" Daily Activity     Outcome Measure   Help from another person eating meals?: A Little Help from another person taking care of personal grooming?: A Little Help from another person toileting, which includes using toliet, bedpan, or urinal?: A Little Help from another person bathing (including washing, rinsing, drying)?: A Little Help from another person to put on and taking off regular upper body clothing?: A Little Help from another person to put on and taking off regular lower body clothing?: A Little 6 Click Score: 18    End of Session    OT Visit Diagnosis: Other symptoms and signs involving the nervous system (R29.898);Hemiplegia and hemiparesis;Muscle weakness (generalized) (M62.81) Hemiplegia - Right/Left: Right Hemiplegia - dominant/non-dominant: Dominant Hemiplegia - caused by: Cerebral infarction   Activity Tolerance Patient tolerated treatment well   Patient Left in bed;with call bell/phone within reach;with family/visitor present   Nurse Communication Mobility status        Time: [id number] OT Time Calculation (min): 32 min  Charges: OT General Charges $OT Visit: 1 Visit OT Treatments $Self Care/Home Management : 8-22 mins $Therapeutic Activity: 8-22 mins  Luisa DagoHilary Londell Noll, OT/L   Acute OT Clinical Specialist Acute Rehabilitation Services Pager [phone] Office [phone]    [facility]ILL[facility] 03/17/2018, 1:54 PM

## 2018-03-17 NOTE — Progress Notes (Signed)
ANTICOAGULATION CONSULT NOTE - Follow Up Consult  Pharmacy Consult for enoxaparin and warfarin Indication: stroke likely secondary to carotid dissection  Allergies  Allergen Reactions  . Penicillins Anaphylaxis  . Aspirin   . Lisinopril     REACTION: Questionable angioedema  to lisinopril    Patient Measurements: Height: 5\' 6"  (167.6 cm) Weight: 165 lb (74.8 kg) IBW/kg (Calculated) : 63.8 Heparin Dosing Weight: 75kg  Vital Signs: Temp: 97.6 F (36.4 C) (09/24 0819) Temp Source: Oral (09/24 0819) BP: 118/79 (09/24 0819) Pulse Rate: 84 (09/24 0819)  Labs: Recent Labs    03/14/18 1912 03/14/18 1921 03/15/18 0428 03/16/18 0002 03/16/18 0755 03/17/18 0612  HGB 12.5* 13.9 12.1* 11.8*  --  12.3*  HCT 38.8* 41.0 40.4 39.6  --  41.1  PLT 339  --  332 349  --  329  APTT 26  --   --   --   --   --   LABPROT 13.0  --   --   --   --  13.3  INR 0.99  --   --   --   --  1.02  HEPARINUNFRC  --   --   --  0.17* 0.53  --   CREATININE 0.90 0.80 0.73  --   --   --     Estimated Creatinine Clearance: 91.9 mL/min (by C-G formula based on SCr of 0.73 mg/dL).   Medications:  Medications Prior to Admission  Medication Sig Dispense Refill Last Dose  . glyBURIDE (DIABETA) 5 MG tablet Take 5 mg by mouth daily with breakfast.   Past Month at Unknown time  . metFORMIN (GLUCOPHAGE) 1000 MG tablet Take 1 tablet (1,000 mg total) by mouth 2 (two) times daily with a meal. 60 tablet  Past Month at Unknown time  . glucose blood (ACCU-CHEK AVIVA) test strip Use as instructed 100 each  Not Taking   Scheduled:  . atorvastatin  80 mg Oral q1800  . enoxaparin (LOVENOX) injection  1 mg/kg Subcutaneous Q12H  . insulin aspart  0-15 Units Subcutaneous TID WC  . insulin aspart  0-5 Units Subcutaneous QHS  . insulin glargine  10 Units Subcutaneous Daily  . Warfarin - Pharmacist Dosing Inpatient   Does not apply q1800   Infusions:  . sodium chloride 10 mL/hr at 03/15/18 1500    Assessment: Rodney Mcgee  was recently admitted for right arm weakness. His CT showed carotid dissection that led to CVA. Neurology initially recommended low dose anticoagulation with IV heparin then transition to coumadin.  Heparin was started on 9/22.  Seen by neurology 9/23 who now recommends change to Lovenox >> Coumadin in order to facilitate discharge home.  INR 1.02  Goal of Therapy:  INR 2-3 Monitor platelets by anticoagulation protocol: Yes   Plan:  Lovenox 75mg  SQ q12h  Coumadin 7.5mg  PO x 1 tonight Daily INR, s/s bleeding Coumadin education  Tianna Baus A. Jeanella CrazePierce, PharmD, BCPS Clinical Pharmacist  Pager: [phone] Please utilize Amion for appropriate phone number to reach the unit pharmacist [facility]    03/17/2018 10:06 AM

## 2018-03-17 NOTE — Progress Notes (Signed)
Physical Therapy Treatment Patient Details Name: Rodney Mcgee MRN: [id number] DOB: December 22, 1960 Today's Date: 03/17/2018    History of Present Illness  Rodney Mcgee  is a 57 y.o. male, with history of diabetes mellitus, hypertension, hyperlipidemia, tobacco abuse came to hospital with right hand numbness.  Patient says that he woke up after a nap and noticed numbness of the right upper extremity especially right hand.  He was unable to hold anything with the right hand.  He denies any weakness or numbness in the leg. 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  Subtle hypoattenuation of left frontal gyrus which could represent acute infarction.    PT Comments    Pt tolerated treatment well. AMB and negotiated stairs independently. Recommend no further need of PT services. Anticipate patient safe to d/c home.   Follow Up Recommendations  No PT follow up     Equipment Recommendations  None recommended by PT    Recommendations for Other Services       Precautions / Restrictions Precautions Precautions: None Restrictions Weight Bearing Restrictions: No    Mobility  Bed Mobility Overal bed mobility:              General bed mobility comments: Pt sitting EOB upon arrival  Transfers Overall transfer level: Independent Equipment used: None             General transfer comment: No difficulty  Ambulation/Gait Ambulation/Gait assistance: Independent Gait Distance (Feet): 150 Feet Assistive device: None Gait Pattern/deviations: WFL(Within Functional Limits);Step-through pattern Gait velocity: decreased  Gait velocity interpretation: >2.62 ft/sec, indicative of community ambulatory General Gait Details: used music to cue an increase in gait cadence      Stairs Stairs: Yes Stairs assistance: Modified independent (Device/Increase  time) Stair Management: Forwards;One rail Left;Alternating pattern Number of Stairs: 20 General stair comments: cues for safety and slowing down especially when descending    Wheelchair Mobility    Modified Rankin (Stroke Patients Only) Modified Rankin (Stroke Patients Only) Pre-Morbid Rankin Score: No symptoms Modified Rankin: Slight disability     Balance Overall balance assessment: Independent                                          Cognition Arousal/Alertness: Awake/alert Behavior During Therapy: WFL for tasks assessed/performed Overall Cognitive Status: Within Functional Limits for tasks assessed                                        Exercises    General Comments General comments (skin integrity, edema, etc.): No complaints of increased dizziness during activity       Pertinent Vitals/Pain Pain Assessment: No/denies pain    Home Living                      Prior Function            PT Goals (current goals can now be found in the care plan section) Acute Rehab PT Goals Patient Stated Goal: Hopes for R hand to get better for return to work PT Goal Formulation: With patient Time For Goal Achievement: 03/22/18 Potential to Achieve Goals: Good Progress towards PT goals: (P)  Goals met/education completed, patient discharged from PT    Frequency    Min 4X/week      PT Plan Discharge plan needs to be updated(goals met no further acute PT needs)    Co-evaluation              AM-PAC PT "6 Clicks" Daily Activity  Outcome Measure  Difficulty turning over in bed (including adjusting bedclothes, sheets and blankets)?: A Little Difficulty moving from lying on back to sitting on the side of the bed? : A Little Difficulty sitting down on and standing up from a chair with arms (e.g., wheelchair, bedside commode, etc,.)?: None Help needed moving to and from a bed to chair (including a wheelchair)?: None Help  needed walking in hospital room?: None Help needed climbing 3-5 steps with a railing? : None 6 Click Score: 22    End of Session Equipment Utilized During Treatment: Gait belt Activity Tolerance: Patient tolerated treatment well Patient left: in chair;with call bell/phone within reach Nurse Communication: Mobility status PT Visit Diagnosis: Unsteadiness on feet (R26.81);Other abnormalities of gait and mobility (R26.89)     Time: 1610-9604 PT Time Calculation (min) (ACUTE ONLY): 14 min  Charges:                        Alanda Slim SPT    Cira Deyoe 03/17/2018, 4:15 PM

## 2018-03-17 NOTE — Progress Notes (Signed)
[facility]. CM updated the MD.  CM provided him with information on clinics [place] that [place] follow his coumadin levels.  CM following for medication assistance at d/c.

## 2018-03-18 LAB — CBC
HEMATOCRIT: 39.8 % (ref 39.0–52.0)
Hemoglobin: 11.8 g/dL — ABNORMAL LOW (ref 13.0–17.0)
MCH: 21.7 pg — ABNORMAL LOW (ref 26.0–34.0)
MCHC: 29.6 g/dL — AB (ref 30.0–36.0)
MCV: 73.2 fL — AB (ref 78.0–100.0)
Platelets: 304 10*3/uL (ref 150–400)
RBC: 5.44 MIL/uL (ref 4.22–5.81)
RDW: 13.2 % (ref 11.5–15.5)
WBC: 9.7 10*3/uL (ref 4.0–10.5)

## 2018-03-18 LAB — GLUCOSE, CAPILLARY
GLUCOSE-CAPILLARY: 165 mg/dL — AB (ref 70–99)
GLUCOSE-CAPILLARY: 172 mg/dL — AB (ref 70–99)
Glucose-Capillary: 208 mg/dL — ABNORMAL HIGH (ref 70–99)
Glucose-Capillary: 180 mg/dL — ABNORMAL HIGH (ref 70–99)

## 2018-03-18 LAB — PROTIME-INR
INR: 1.06
Prothrombin Time: 13.7 seconds (ref 11.4–15.2)

## 2018-03-18 MED ORDER — WARFARIN SODIUM 5 MG PO TABS
10.0000 mg | ORAL_TABLET | Freq: Once | ORAL | Status: AC
  Administered 2018-03-18: 10 mg via ORAL
  Filled 2018-03-18: qty 2

## 2018-03-18 NOTE — Progress Notes (Signed)
Occupational Therapy Treatment Patient Details Name: Rodney Mcgee MRN: [id number] DOB: 27-May-1961 Today's Date: 03/18/2018    History of present illness  Rodney Mcgee  is a 57 y.o. male, with history of diabetes mellitus, hypertension, hyperlipidemia, tobacco abuse came to hospital with right hand numbness.  Patient says that he woke up after a nap and noticed numbness of the right upper extremity especially right hand.  He was unable to hold anything with the right hand.  He denies any weakness or numbness in the leg. 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  Subtle hypoattenuation of left frontal gyrus which could represent acute infarction.   OT comments  Patient continues to be limited by decreased coordination of R UE.  Demonstrating improving proximal strength but fatigues easily.  Engaged in proximal strengthening, coordination exercises for hand, and B integration exercises.  Educated on proximal strengthening for distal coordination.  Continues to demonstrates gross grasp patterns, hand over hand support to isolate pincer grasp and supination for scooping while simulated feeding task.  Encouraged patient to complete proximal strengthening exercises (towel slide, flexion exercises, and cup stacking). Will continue to follow.    Follow Up Recommendations  Outpatient OT;Supervision - Intermittent    Equipment Recommendations  None recommended by OT    Recommendations for Other Services      Precautions / Restrictions Precautions Precautions: None Restrictions Weight Bearing Restrictions: No       Mobility Bed Mobility Overal bed mobility: Independent                Transfers                      Balance Overall balance assessment: Independent                                         ADL either  performed or assessed with clinical judgement   ADL Overall ADL's : Needs assistance/impaired Eating/Feeding: Minimal assistance;With adaptive utensils Eating/Feeding Details (indicate cue type and reason): using dominant R UE to scoop ice chips with min assist to scoop using red foam handles; simulated task                                   General ADL Comments: session to focus on R UE strength/coordinatino      Vision       Perception     Praxis      Cognition Arousal/Alertness: Awake/alert Behavior During Therapy: WFL for tasks assessed/performed Overall Cognitive Status: Within Functional Limits for tasks assessed                                          Exercises Exercises: Other exercises Other Exercises Other Exercises: yellow theraputty ex- gross grasp Other Exercises: fine motor/coordination exercises with proximal strength pincer grasp with small theraputty balls reaching cross body to place into container (hand over hand support for pincer grasp), table top finger extension   Other Exercises: cup stacking with verbal and tactile cueing for positioning of thumb, B integration steadying cups with L hand  Other  Exercises: Shoulder flexion, elbow flexion/extension, supination/pronation, wrist flexion/extension D1 and D2 diagonals x 10 reps; difficutly with maintaining elbow extension during diagonals Other Exercises: towel slides on table top, for proximal stabilization    Shoulder Instructions       General Comments      Pertinent Vitals/ Pain       Pain Assessment: No/denies pain  Home Living                                          Prior Functioning/Environment              Frequency  Min 3X/week        Progress Toward Goals  OT Goals(current goals can now be found in the care plan section)  Progress towards OT goals: Progressing toward goals  Acute Rehab OT Goals Patient Stated Goal: Hopes  for R hand to get better for return to work OT Goal Formulation: With patient Time For Goal Achievement: 03/29/18 Potential to Achieve Goals: Good  Plan Discharge plan remains appropriate    Co-evaluation                 AM-PAC PT "6 Clicks" Daily Activity     Outcome Measure   Help from another person eating meals?: A Little Help from another person taking care of personal grooming?: A Little Help from another person toileting, which includes using toliet, bedpan, or urinal?: A Little Help from another person bathing (including washing, rinsing, drying)?: A Little Help from another person to put on and taking off regular upper body clothing?: A Little Help from another person to put on and taking off regular lower body clothing?: A Little 6 Click Score: 18    End of Session    OT Visit Diagnosis: Other symptoms and signs involving the nervous system (R29.898);Hemiplegia and hemiparesis;Muscle weakness (generalized) (M62.81) Hemiplegia - Right/Left: Right Hemiplegia - dominant/non-dominant: Dominant Hemiplegia - caused by: Cerebral infarction   Activity Tolerance Patient tolerated treatment well   Patient Left in bed;with call bell/phone within reach;with family/visitor present   Nurse Communication          Time: 1610-9604 OT Time Calculation (min): 19 min  Charges: OT General Charges $OT Visit: 1 Visit OT Treatments $Self Care/Home Management : 8-22 mins  Chancy Milroy, OT Acute Rehabilitation Services Pager [phone] Office [phone]    Chancy Milroy 03/18/2018, 1:03 PM

## 2018-03-18 NOTE — Progress Notes (Signed)
Patient Demographics:    Rodney Mcgee, is a 57 y.o. male, DOB - 1961-06-15, ZOX:[id number]  Admit date - 03/14/2018   Admitting Physician Lorretta Harp, MD  Outpatient Primary MD for the patient is Patient, No Pcp Per  LOS - 3   Chief Complaint  Patient presents with  . Numbness    right arm        Subjective:    Rodney Mcgee today has no fevers, no emesis,  No chest pain, right upper extremity weakness persist  Assessment  & Plan :    Active Problems:   Stroke (cerebrum) (HCC)  Brain MRI: 1. Several foci of acute/early subacute infarction are present throughout the left MCA distribution predominantly posteriorly. Positive for petechial hemorrhage. No mass effect. 2. Mild volume loss of the brain.   Brief Summary 57 year old male, resident of [place] who was visiting family in [place], [place], works in the Psychologist, forensic at [employer], PMH of DM 2, HTN, HLD, tobacco abuse presented initially to [facility] with sudden onset of right upper extremity numbness and weakness without any other stroke like symptoms admitted 03/15/18 from [facility] with CTA Head/Neck showing carotid Artery dissection with intramural hematoma.  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  As per neurology, 3 months anticoagulation.  Now on Lovenox bridge and Coumadin  3)HTN--- overall stable BP,, avoid over aggressive BP control, allow some degree  of permissive hypertension  4)DM2-poorly controlled, A1c 10.9, right upper extremity weakness makes it difficult for patient to self inject insulin, 70/30 insulin 2 units twice daily with meals, Use Novolog/Humalog Sliding scale insulin with Accu-Cheks/Fingersticks as ordered   5)Tobacco Abuse-smoking cessation advised  Disposition/Need for in-Hospital Stay- patient unable to be discharged at this time due to continue Lovenox bridge and discharge when INR is therapeutic, [place] is from out of state/[place] .  Patient currently states that he does not have a ride back home to [place] until the weekend,  Code Status : FULL    Disposition Plan  : t home  Consults  : Neurology/case management/physical therapy   DVT Prophylaxis  :  Lovenox   Lab Results  Component Value Date   PLT 304 03/18/2018    Inpatient Medications  Scheduled Meds: . atorvastatin  80 mg Oral q1800  . enoxaparin (LOVENOX) injection  1 mg/kg Subcutaneous Q12H  . insulin aspart  0-5 Units Subcutaneous QHS  . insulin aspart  0-9 Units Subcutaneous TID WC  . insulin aspart protamine- aspart  10 Units Subcutaneous BID WC  . Warfarin - Pharmacist Dosing Inpatient   Does not apply q1800   Continuous Infusions: . sodium chloride 10 mL/hr at 03/15/18 1500   PRN Meds:.acetaminophen **OR** acetaminophen (TYLENOL) oral liquid 160 mg/5 mL **OR** acetaminophen    Anti-infectives (  From admission, onward)   None        Objective:   Vitals:   03/18/18 0347 03/18/18 0749 03/18/18 1145 03/18/18 1548  BP: 130/74 99/66 129/85 129/83  Pulse: 90 93 90 88  Resp: 18 18 18 18   Temp: 97.8 F (36.6 C) 98 F (36.7 C) 98.9 F (37.2 C) 97.8 F (36.6 C)  TempSrc: Oral Oral Oral Oral  SpO2: 98% 99% 98% 98%  Weight:      Height:        Wt Readings from Last 3 Encounters:  03/14/18 74.8 kg  09/06/14 81.6 kg  08/25/14 83.8 kg     Intake/Output Summary (Last 24 hours) at 03/18/2018 1903 Last data filed  at 03/18/2018 1835 Gross per 24 hour  Intake 720 ml  Output 400 ml  Net 320 ml     Physical Exam Patient is examined daily including today on 03/18/18 , exams remain the same as of yesterday except that has changed   Gen:- Awake Alert, no acute distress HEENT:- [place].AT, No sclera icterus Mouth-poor oral hygiene/dentition Neck-Supple Neck,No JVD,.  Lungs-  CTAB , good air movement CV- S1, S2 normal, regular Abd-  +ve B.Sounds, Abd Soft, No tenderness,    Extremity/Skin:- No  edema,   good pulses Psych-affect is appropriate, oriented x3 Neuro-right upper extremity weakness noted   Data Review:   Micro Results No results found for this or any previous visit (from the past 240 hour(s)).  Radiology Reports Ct Angio Head W Or Wo Contrast  Result Date: 03/14/2018 CLINICAL DATA:  RIGHT upper extremity weakness and numbness. Onset earlier today. EXAM: CT ANGIOGRAPHY HEAD AND NECK TECHNIQUE: Multidetector CT imaging of the head and neck was performed using the standard protocol during bolus administration of intravenous contrast. Multiplanar CT image reconstructions and MIPs were obtained to evaluate the vascular anatomy. Carotid stenosis measurements (when applicable) are obtained utilizing NASCET criteria, using the distal internal carotid diameter as the denominator. CONTRAST:  75mL ISOVUE-370 IOPAMIDOL (ISOVUE-370) INJECTION 76% COMPARISON:  Noncontrast CT head at 16:27 hours FINDINGS: CTA NECK FINDINGS Aortic arch: Bovine trunk. Imaged portion shows no evidence of aneurysm or dissection. No significant stenosis of the major arch vessel origins. Right carotid system: No evidence of dissection, stenosis (50% or greater) or occlusion. Left carotid system: There is a long segment of dissection involving the mid common carotid artery on the LEFT with intraluminal thrombus. The dissection extends over 4 cm, and appears to terminate 2 cm below the bifurcation. Estimated 75% stenosis based on luminal  measurements of 1.2/4.7 mm. Carotid bifurcation shows only minor atheromatous change. No cervical ICA disease. Vertebral arteries: LEFT vertebral dominant. RIGHT vertebral diminutive with atheromatous change at its origin. No ostial stenosis on the LEFT. Skeleton: Spondylosis.  No worrisome osseous lesion. Other neck: No neck masses. Upper chest: No pneumothorax or mass. Review of the MIP images confirms the above findings CTA HEAD FINDINGS Anterior circulation: Nonstenotic atheromatous change of the petrous and cavernous ICA segments with calcifications. ICA termini widely patent. No M1 or A1 stenosis of significance. No M2 or M3 branch irregularity. Posterior circulation: No significant stenosis, proximal occlusion, aneurysm, or vascular malformation. Venous sinuses: As permitted by contrast timing, patent. Anatomic variants: None of significance.  Fetal LEFT PCA. Delayed phase: There could be developing hypodensity of a LEFT frontal gyrus near the convexity, see image 26 series 12; this was not clearly present on earlier CT. No abnormal enhancement is observed. Review of the MIP images confirms the  above findings IMPRESSION: Long segment LEFT common carotid artery dissection, extending over approximately 4 cm, with intraluminal thrombus, estimated 75% stenosis. No intracranial emboli are identified, but there is subtle hypoattenuation of a LEFT frontal gyrus, which could represent acute infarction. MRI brain recommended for further evaluation. Nonstenotic intracranial atherosclerotic change of the BILATERAL skull base internal carotid arteries. No evidence for emergent large vessel occlusion. These results were called by telephone at the time of interpretation on 03/14/2018 at 9:44 pm to Dr. Clarene Duke, Who verbally acknowledged these results. Electronically Signed   By: Elsie Stain M.D.   On: 03/14/2018 21:52   Dg Chest 2 View  Result Date: 03/15/2018 CLINICAL DATA:  57 year old male with stroke. EXAM: CHEST -  2 VIEW COMPARISON:  Chest radiograph dated 08/20/2013 FINDINGS: The heart size and mediastinal contours are within normal limits. Both lungs are clear. The visualized skeletal structures are unremarkable. IMPRESSION: No active cardiopulmonary disease. Electronically Signed   By: Elgie Collard M.D.   On: 03/15/2018 01:34   Ct Angio Neck W Or Wo Contrast  Result Date: 03/14/2018 CLINICAL DATA:  RIGHT upper extremity weakness and numbness. Onset earlier today. EXAM: CT ANGIOGRAPHY HEAD AND NECK TECHNIQUE: Multidetector CT imaging of the head and neck was performed using the standard protocol during bolus administration of intravenous contrast. Multiplanar CT image reconstructions and MIPs were obtained to evaluate the vascular anatomy. Carotid stenosis measurements (when applicable) are obtained utilizing NASCET criteria, using the distal internal carotid diameter as the denominator. CONTRAST:  75mL ISOVUE-370 IOPAMIDOL (ISOVUE-370) INJECTION 76% COMPARISON:  Noncontrast CT head at 16:27 hours FINDINGS: CTA NECK FINDINGS Aortic arch: Bovine trunk. Imaged portion shows no evidence of aneurysm or dissection. No significant stenosis of the major arch vessel origins. Right carotid system: No evidence of dissection, stenosis (50% or greater) or occlusion. Left carotid system: There is a long segment of dissection involving the mid common carotid artery on the LEFT with intraluminal thrombus. The dissection extends over 4 cm, and appears to terminate 2 cm below the bifurcation. Estimated 75% stenosis based on luminal measurements of 1.2/4.7 mm. Carotid bifurcation shows only minor atheromatous change. No cervical ICA disease. Vertebral arteries: LEFT vertebral dominant. RIGHT vertebral diminutive with atheromatous change at its origin. No ostial stenosis on the LEFT. Skeleton: Spondylosis.  No worrisome osseous lesion. Other neck: No neck masses. Upper chest: No pneumothorax or mass. Review of the MIP images confirms  the above findings CTA HEAD FINDINGS Anterior circulation: Nonstenotic atheromatous change of the petrous and cavernous ICA segments with calcifications. ICA termini widely patent. No M1 or A1 stenosis of significance. No M2 or M3 branch irregularity. Posterior circulation: No significant stenosis, proximal occlusion, aneurysm, or vascular malformation. Venous sinuses: As permitted by contrast timing, patent. Anatomic variants: None of significance.  Fetal LEFT PCA. Delayed phase: There could be developing hypodensity of a LEFT frontal gyrus near the convexity, see image 26 series 12; this was not clearly present on earlier CT. No abnormal enhancement is observed. 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 No intracranial emboli are identified, but there is subtle hypoattenuation of a LEFT frontal gyrus, which could represent acute infarction. MRI brain recommended for further evaluation. Nonstenotic intracranial atherosclerotic change of the BILATERAL skull base internal carotid arteries. No evidence for emergent large vessel occlusion. These results were called by telephone at the time of interpretation on 03/14/2018 at  9:44 pm to Dr. Clarene Duke, Who verbally acknowledged these results. Electronically Signed   By: Elsie Stain M.D.   On: 03/14/2018 21:52   Mr Brain Wo Contrast  Result Date: 03/15/2018 CLINICAL DATA:  58 y/o M; sudden onset of right upper extremity numbness and weakness. EXAM: MRI HEAD WITHOUT CONTRAST TECHNIQUE: Multiplanar, multiecho pulse sequences of the brain and surrounding structures were obtained without intravenous contrast. COMPARISON:  03/14/2018 CT head and CTA head. FINDINGS: Brain: Several foci of reduced diffusion are present throughout the left MCA distribution, predominantly posteriorly, compatible with acute/early  subacute infarction. 2 punctate foci are also present within the left occipital lobe. No associated mass effect. There are several foci of susceptibility hypointensity scattered throughout the region of infarction compatible with petechial hemorrhage. No extra-axial collection, hydrocephalus, effacement of basilar cisterns, or herniation. Mild volume loss of the brain. Vascular: Normal flow voids. Skull and upper cervical spine: Normal marrow signal. Sinuses/Orbits: Mild diffuse paranasal sinus mucosal thickening and large maxillary sinus mucous retention cyst. Aerosolized secretions within the right maxillary sinus may represent acute sinusitis. No abnormal signal of the mastoid air cells. Other: None. IMPRESSION: 1. Several foci of acute/early subacute infarction are present throughout the left MCA distribution predominantly posteriorly. Positive for petechial hemorrhage. No mass effect. 2. Mild volume loss of the brain. 3. Paranasal sinus disease with right maxillary aerosolized secretions which may represent acute sinusitis. These results will be called to the ordering clinician or representative by the Radiologist Assistant, and communication documented in the PACS or zVision Dashboard. Electronically Signed   By: Mitzi Hansen M.D.   On: 03/15/2018 23:02   Ct Head Code Stroke Wo Contrast  Result Date: 03/14/2018 CLINICAL DATA:  Code stroke. RIGHT-sided numbness for 3 hours. RIGHT upper extremity weakness. EXAM: CT HEAD WITHOUT CONTRAST TECHNIQUE: Contiguous axial images were obtained from the base of the skull through the vertex without intravenous contrast. COMPARISON:  None. FINDINGS: Brain: No evidence for acute infarction, hemorrhage, mass lesion, hydrocephalus, or extra-axial fluid. Mild premature cerebral and cerebellar atrophy, with ventricular prominence particularly of the fourth ventricle reflecting vermian brain substance loss. Vascular: No hyperdense vessel or unexpected  calcification. Skull: Normal. Negative for fracture or focal lesion. Scalp sebaceous cysts. Sinuses/Orbits: Significant opacity of the RIGHT maxillary sinus. Small retention cyst in the LEFT maxillary sinus. Frontal sinuses hypoplastic. Ethmoid sphenoid sinuses unremarkable. Dense lenticular opacities. Other: None. ASPECTS [facility] Stroke Program Early CT Score) - Ganglionic level infarction (caudate, lentiform nuclei, internal capsule, insula, M1-M3 cortex): 7 - Supraganglionic infarction (M4-M6 cortex): 3 Total score (0-10 with 10 being normal): 10 IMPRESSION: 1. Atrophy.  No evidence of acute stroke. 2. ASPECTS is 10. These results were called by telephone at the time of interpretation on 03/14/2018 at 7:36 pm to Dr. Samuel Jester , who verbally acknowledged these results. Electronically Signed   By: Elsie Stain M.D.   On: 03/14/2018 19:42     CBC Recent Labs  Lab 03/14/18 1912 03/14/18 1921 03/15/18 0428 03/16/18 0002 03/17/18 0612 03/18/18 0418  WBC 8.4  --  7.8 8.8 6.5 9.7  HGB 12.5* 13.9 12.1* 11.8* 12.3* 11.8*  HCT 38.8* 41.0 40.4 39.6 41.1 39.8  PLT 339  --  332 349 329 304  MCV 70.9*  --  72.3* 72.1* 72.0* 73.2*  MCH 22.9*  --  21.6* 21.5* 21.5* 21.7*  MCHC 32.2  --  30.0 29.8* 29.9* 29.6*  RDW 13.5  --  13.5 13.5 13.3 13.2  LYMPHSABS 2.6  --   --   --   --   --  MONOABS 0.6  --   --   --   --   --   EOSABS 0.3  --   --   --   --   --   BASOSABS 0.0  --   --   --   --   --     Chemistries  Recent Labs  Lab 03/14/18 1912 03/14/18 1921 03/15/18 0428  NA 134* 135  --   K 4.7 4.7  --   CL 102 101  --   CO2 24  --   --   GLUCOSE 328* 324*  --   BUN 16 17  --   CREATININE 0.90 0.80 0.73  CALCIUM 9.1  --   --   AST 21  --   --   ALT 23  --   --   ALKPHOS 96  --   --   BILITOT 0.4  --   --    ------------------------------------------------------------------------------------------------------------------ No results for input(s): CHOL, HDL, LDLCALC, TRIG, CHOLHDL,  LDLDIRECT in the last 72 hours.  Lab Results  Component Value Date   HGBA1C 10.9 (H) 03/15/2018   ------------------------------------------------------------------------------------------------------------------ No results for input(s): TSH, T4TOTAL, T3FREE, THYROIDAB in the last 72 hours.  Invalid input(s): FREET3 ------------------------------------------------------------------------------------------------------------------ No results for input(s): VITAMINB12, FOLATE, FERRITIN, TIBC, IRON, RETICCTPCT in the last 72 hours.  Coagulation profile Recent Labs  Lab 03/14/18 1912 03/17/18 0612 03/18/18 0418  INR 0.99 1.02 1.06    No results for input(s): DDIMER in the last 72 hours.  Cardiac Enzymes No results for input(s): CKMB, TROPONINI, MYOGLOBIN in the last 168 hours.  Invalid input(s): CK ------------------------------------------------------------------------------------------------------------------ No results found for: BNP   Shon Hale M.D on 03/18/2018 at 7:03 PM  Pager---[phone] Go to [email or web address] - password [email or web address] for contact info  [facility] - Office  [phone]

## 2018-03-18 NOTE — Progress Notes (Signed)
ANTICOAGULATION CONSULT NOTE - Follow Up Consult  Pharmacy Consult for enoxaparin and warfarin Indication: stroke likely secondary to carotid dissection  Allergies  Allergen Reactions  . Penicillins Anaphylaxis  . Aspirin   . Lisinopril     REACTION: Questionable angioedema  to lisinopril    Patient Measurements: Height: 5\' 6"  (167.6 cm) Weight: 165 lb (74.8 kg) IBW/kg (Calculated) : 63.8 Heparin Dosing Weight: 75kg  Vital Signs: Temp: 98 F (36.7 C) (09/25 0749) Temp Source: Oral (09/25 0749) BP: 99/66 (09/25 0749) Pulse Rate: 93 (09/25 0749)  Labs: Recent Labs    03/16/18 0002 03/16/18 0755 03/17/18 0612 03/18/18 0418  HGB 11.8*  --  12.3* 11.8*  HCT 39.6  --  41.1 39.8  PLT 349  --  329 304  LABPROT  --   --  13.3 13.7  INR  --   --  1.02 1.06  HEPARINUNFRC 0.17* 0.53  --   --     Estimated Creatinine Clearance: 91.9 mL/min (by C-G formula based on SCr of 0.73 mg/dL).    Assessment: Andrea was recently admitted for right arm weakness. His CT showed carotid dissection that led to CVA. Neurology initially recommended low dose anticoagulation with IV heparin then transition to coumadin.  Heparin was started on 9/22.  Seen by neurology 9/23 who now recommends change to Lovenox >> Coumadin in order to facilitate discharge home.  INR 1.06 (no movement)  Goal of Therapy:  INR 2-3 Monitor platelets by anticoagulation protocol: Yes   Plan:  Lovenox 75mg  SQ q12h  Coumadin 10 mg po x 1 tonight Daily INR, s/s bleeding  Thank you Okey Regal, PharmD [phone] Please utilize Amion for appropriate phone number to reach the unit pharmacist [facility])    03/18/2018 11:44 AM

## 2018-03-18 NOTE — Progress Notes (Signed)
Attempted insulin injection teaching, didn't go so well. Due to patient right hand weakness he is not able to tell where patient is sticking the needle. Right hand is not usable. Please consider different alternative at discharge, thanks.

## 2018-03-19 LAB — CBC
HCT: 40.1 % (ref 39.0–52.0)
Hemoglobin: 12.1 g/dL — ABNORMAL LOW (ref 13.0–17.0)
MCH: 21.8 pg — ABNORMAL LOW (ref 26.0–34.0)
MCHC: 30.2 g/dL (ref 30.0–36.0)
MCV: 72.1 fL — AB (ref 78.0–100.0)
PLATELETS: 304 10*3/uL (ref 150–400)
RBC: 5.56 MIL/uL (ref 4.22–5.81)
RDW: 13.2 % (ref 11.5–15.5)
WBC: 6.4 10*3/uL (ref 4.0–10.5)

## 2018-03-19 LAB — GLUCOSE, CAPILLARY
GLUCOSE-CAPILLARY: 199 mg/dL — AB (ref 70–99)
GLUCOSE-CAPILLARY: 156 mg/dL — AB (ref 70–99)
GLUCOSE-CAPILLARY: 169 mg/dL — AB (ref 70–99)
Glucose-Capillary: 172 mg/dL — ABNORMAL HIGH (ref 70–99)

## 2018-03-19 LAB — PROTIME-INR
INR: 1.18
PROTHROMBIN TIME: 14.9 s (ref 11.4–15.2)

## 2018-03-19 MED ORDER — WARFARIN SODIUM 5 MG PO TABS
10.0000 mg | ORAL_TABLET | Freq: Once | ORAL | Status: AC
  Administered 2018-03-19: 10 mg via ORAL
  Filled 2018-03-19: qty 2

## 2018-03-19 NOTE — Progress Notes (Signed)
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 Penicillins Anaphylaxis  . Aspirin   . Lisinopril     REACTION: Questionable angioedema  to lisinopril    Patient Measurements: Height: 5\' 6"  (167.6 cm) Weight: 165 lb (74.8 kg) IBW/kg (Calculated) : 63.8 Heparin Dosing Weight: 75kg  Vital Signs: Temp: 97.6 F (36.4 C) (09/26 0733) Temp Source: Oral (09/26 0733) BP: 137/76 (09/26 0733) Pulse Rate: 79 (09/26 0733)  Labs: Recent Labs    03/17/18 0612 03/18/18 0418 03/19/18 0617  HGB 12.3* 11.8* 12.1*  HCT 41.1 39.8 40.1  PLT 329 304 304  LABPROT 13.3 13.7 14.9  INR 1.02 1.06 1.18    Estimated Creatinine Clearance: 91.9 mL/min (by C-G formula based on SCr of 0.73 mg/dL).    Assessment: Rodney Mcgee was recently admitted for right arm weakness. His CT showed carotid dissection that led to CVA. Neurology initially recommended low dose anticoagulation with IV heparin then transition to coumadin.  Heparin was started on 9/22.  Seen by neurology 9/23 who now recommends change to Lovenox >> Coumadin in order to facilitate discharge home.  INR 1.18 (no movement)  Goal of Therapy:  INR 2-3 Monitor platelets by anticoagulation protocol: Yes   Plan:  Lovenox 75mg  SQ q12h  Repeat Coumadin 10 mg po x 1 tonight Daily INR, s/s bleeding  Thank you Okey Regal, PharmD [phone] Please utilize Amion for appropriate phone number to reach the unit pharmacist [facility])    03/19/2018 10:38 AM

## 2018-03-19 NOTE — Progress Notes (Signed)
Patient Demographics:    Rodney Mcgee, is a 57 y.o. male, DOB - 1960/06/26, ZOX:[id number]  Admit date - 03/14/2018   Admitting Physician Lorretta Harp, MD  Outpatient Primary MD for the patient is Patient, No Pcp Per  LOS - 4   Chief Complaint  Patient presents with  . Numbness    right arm        Subjective:    Rodney Mcgee today has no fevers, no emesis,  No chest pain, no headaches no blurry vision right upper extremity weakness persist  Assessment  & Plan :    Active Problems:   Stroke (cerebrum) (HCC)  Brain MRI: 1. Several foci of acute/early subacute infarction are present throughout the left MCA distribution predominantly posteriorly. Positive for petechial hemorrhage. No mass effect. 2. Mild volume loss of the brain.   Brief Summary 57 year old male, resident of [place] who was visiting family in [place], [place], works in the Psychologist, forensic at [employer], PMH of DM 2, HTN, HLD, tobacco abuse presented initially to [facility] with sudden onset of right upper extremity numbness and weakness without any other stroke like symptoms admitted 03/15/18 from [facility] with CTA Head/Neck showing carotid Artery dissection with intramural hematoma.  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  As per neurology, 3 months anticoagulation.  Now on Lovenox bridge and Coumadin  3)HTN--- overall stable BP,, avoid over  aggressive BP control, allow some degree of permissive hypertension  4)DM2-poorly controlled, A1c 10.9, right upper extremity weakness makes it difficult for patient to self inject insulin, 70/30 insulin 2 units twice daily with meals, Use Novolog/Humalog Sliding scale insulin with Accu-Cheks/Fingersticks as ordered   5)Tobacco Abuse-smoking cessation advised  Disposition/Need for in-Hospital Stay- patient unable to be discharged at this time due to continue Lovenox bridge and discharge when INR is therapeutic, [place] is from [place] state/[place] .  Patient currently states that he does not have a ride back home to [place] until the weekend, D/w Tresa Endo from case management we will try and make plans for possible discharge on Saturday, 03/21/2018 as patient states that he can get somebody from [place] to, pick him up on that date  Code Status : FULL    Disposition Plan  : home  Consults  : Neurology/case management/physical therapy   DVT Prophylaxis  :  Lovenox   Lab Results  Component Value Date   PLT 304 03/19/2018    Inpatient Medications  Scheduled Meds: . atorvastatin  80 mg Oral q1800  . enoxaparin (LOVENOX) injection  1 mg/kg Subcutaneous Q12H  . insulin aspart  0-5 Units Subcutaneous QHS  . insulin aspart  0-9 Units Subcutaneous TID WC  . insulin aspart protamine- aspart  10 Units Subcutaneous BID WC  . warfarin  10 mg Oral ONCE-1800  . Warfarin - Pharmacist Dosing Inpatient   Does not apply q1800   Continuous Infusions: . sodium chloride 10 mL/hr at 03/15/18 1500   PRN Meds:.acetaminophen **OR** acetaminophen (TYLENOL) oral liquid 160 mg/5 mL **OR** acetaminophen    Anti-infectives (From admission, onward)   None        Objective:   Vitals:   03/18/18 2350 03/19/18 0432 03/19/18 0733 03/19/18 1150  BP: 130/70 140/82 137/76 117/73  Pulse: 89 86 79 86  Resp: 18 18 18 18   Temp: 98 F (36.7 C) 98.3 F (36.8 C) 97.6 F (36.4 C) 98 F  (36.7 C)  TempSrc: Oral Oral Oral Oral  SpO2: 98% 100% 100% 100%  Weight:      Height:        Wt Readings from Last 3 Encounters:  03/14/18 74.8 kg  09/06/14 81.6 kg  08/25/14 83.8 kg     Intake/Output Summary (Last 24 hours) at 03/19/2018 1409 Last data filed at 03/19/2018 0001 Gross per 24 hour  Intake 560 ml  Output 350 ml  Net 210 ml     Physical Exam Patient is examined daily including today on 03/19/18 , exams remain the same as of yesterday except that has changed   Gen:- Awake Alert, no acute distress HEENT:- [place].AT, No sclera icterus Mouth-poor oral hygiene/dentition Neck-Supple Neck,No JVD,.  Lungs-  CTAB , good air movement CV- S1, S2 normal, regular Abd-  +ve B.Sounds, Abd Soft, No tenderness,    Extremity/Skin:- No  edema,   good pulses Psych-affect is appropriate, oriented x3 Neuro-right upper extremity weakness noted, right-sided facial numbness appears to be improving   Data Review:   Micro Results No results found for this or any previous visit (from the past 240 hour(s)).  Radiology Reports Ct Angio Head W Or Wo Contrast  Result Date: 03/14/2018 CLINICAL DATA:  RIGHT upper extremity weakness and numbness. Onset earlier today. EXAM: CT ANGIOGRAPHY HEAD AND NECK TECHNIQUE: Multidetector CT imaging of the head and neck was performed using the standard protocol during bolus administration of intravenous contrast. Multiplanar CT image reconstructions and MIPs were obtained to evaluate the vascular anatomy. Carotid stenosis measurements (when applicable) are obtained utilizing NASCET criteria, using the distal internal carotid diameter as the denominator. CONTRAST:  75mL ISOVUE-370 IOPAMIDOL (ISOVUE-370) INJECTION 76% COMPARISON:  Noncontrast CT head at 16:27 hours FINDINGS: CTA NECK FINDINGS Aortic arch: Bovine trunk. Imaged portion shows no evidence of aneurysm or dissection. No significant stenosis of the major arch vessel origins. Right carotid system: No  evidence of dissection, stenosis (50% or greater) or occlusion. Left carotid system: There is a long segment of dissection involving the mid common carotid artery on the LEFT with intraluminal thrombus. The dissection extends over 4 cm, and appears to terminate 2 cm below the bifurcation. Estimated 75% stenosis based on luminal measurements of 1.2/4.7 mm. Carotid bifurcation shows only minor atheromatous change. No cervical ICA disease. Vertebral arteries: LEFT vertebral dominant. RIGHT vertebral diminutive with atheromatous change at its origin. No ostial stenosis on the LEFT. Skeleton: Spondylosis.  No worrisome osseous lesion. Other neck: No neck masses. Upper chest: No pneumothorax or mass. Review of the MIP images confirms the above findings CTA HEAD FINDINGS Anterior circulation: Nonstenotic atheromatous change of the petrous and cavernous ICA segments with calcifications. ICA termini widely patent. No M1 or A1 stenosis of significance. No M2 or M3 branch irregularity. Posterior circulation: No significant stenosis, proximal occlusion, aneurysm, or vascular malformation. Venous sinuses: As  permitted by contrast timing, patent. Anatomic variants: None of significance.  Fetal LEFT PCA. Delayed phase: There could be developing hypodensity of a LEFT frontal gyrus near the convexity, see image 26 series 12; this was not clearly present on earlier CT. No abnormal enhancement is observed. 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 No intracranial emboli are identified, but there is subtle hypoattenuation of a LEFT frontal gyrus, which could represent acute infarction. MRI brain recommended for further evaluation. Nonstenotic intracranial atherosclerotic change of the BILATERAL skull base internal carotid arteries. No evidence for emergent large vessel  occlusion. These results were called by telephone at the time of interpretation on 03/14/2018 at 9:44 pm to Dr. Clarene Duke, Who verbally acknowledged these results. Electronically Signed   By: Elsie Stain M.D.   On: 03/14/2018 21:52   Dg Chest 2 View  Result Date: 03/15/2018 CLINICAL DATA:  57 year old male with stroke. EXAM: CHEST - 2 VIEW COMPARISON:  Chest radiograph dated 08/20/2013 FINDINGS: The heart size and mediastinal contours are within normal limits. Both lungs are clear. The visualized skeletal structures are unremarkable. IMPRESSION: No active cardiopulmonary disease. Electronically Signed   By: Elgie Collard M.D.   On: 03/15/2018 01:34   Ct Angio Neck W Or Wo Contrast  Result Date: 03/14/2018 CLINICAL DATA:  RIGHT upper extremity weakness and numbness. Onset earlier today. EXAM: CT ANGIOGRAPHY HEAD AND NECK TECHNIQUE: Multidetector CT imaging of the head and neck was performed using the standard protocol during bolus administration of intravenous contrast. Multiplanar CT image reconstructions and MIPs were obtained to evaluate the vascular anatomy. Carotid stenosis measurements (when applicable) are obtained utilizing NASCET criteria, using the distal internal carotid diameter as the denominator. CONTRAST:  75mL ISOVUE-370 IOPAMIDOL (ISOVUE-370) INJECTION 76% COMPARISON:  Noncontrast CT head at 16:27 hours FINDINGS: CTA NECK FINDINGS Aortic arch: Bovine trunk. Imaged portion shows no evidence of aneurysm or dissection. No significant stenosis of the major arch vessel origins. Right carotid system: No evidence of dissection, stenosis (50% or greater) or occlusion. Left carotid system: There is a long segment of dissection involving the mid common carotid artery on the LEFT with intraluminal thrombus. The dissection extends over 4 cm, and appears to terminate 2 cm below the bifurcation. Estimated 75% stenosis based on luminal measurements of 1.2/4.7 mm. Carotid bifurcation shows only minor  atheromatous change. No cervical ICA disease. Vertebral arteries: LEFT vertebral dominant. RIGHT vertebral diminutive with atheromatous change at its origin. No ostial stenosis on the LEFT. Skeleton: Spondylosis.  No worrisome osseous lesion. Other neck: No neck masses. Upper chest: No pneumothorax or mass. Review of the MIP images confirms the above findings CTA HEAD FINDINGS Anterior circulation: Nonstenotic atheromatous change of the petrous and cavernous ICA segments with calcifications. ICA termini widely patent. No M1 or A1 stenosis of significance. No M2 or M3 branch irregularity. Posterior circulation: No significant stenosis, proximal occlusion, aneurysm, or vascular malformation. Venous sinuses: As permitted by contrast timing, patent. Anatomic variants: None of significance.  Fetal LEFT PCA. Delayed phase: There could be developing hypodensity of a LEFT frontal gyrus near the convexity, see image 26 series 12; this was not clearly present on earlier CT. No abnormal enhancement is observed. 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 No intracranial emboli are identified,  but there is subtle hypoattenuation of a LEFT frontal gyrus, which could represent acute infarction. MRI brain recommended for further evaluation. Nonstenotic intracranial atherosclerotic change of the BILATERAL skull base internal carotid arteries. No evidence for emergent large vessel occlusion. These results were called by telephone at the time of interpretation on 03/14/2018 at 9:44 pm to Dr. Clarene Duke, Who verbally acknowledged these results. Electronically Signed   By: Elsie Stain M.D.   On: 03/14/2018 21:52   Mr Brain Wo Contrast  Result Date: 03/15/2018 CLINICAL DATA:  57 y/o M; sudden onset of right upper extremity numbness and weakness. EXAM: MRI HEAD WITHOUT CONTRAST  TECHNIQUE: Multiplanar, multiecho pulse sequences of the brain and surrounding structures were obtained without intravenous contrast. COMPARISON:  03/14/2018 CT head and CTA head. FINDINGS: Brain: Several foci of reduced diffusion are present throughout the left MCA distribution, predominantly posteriorly, compatible with acute/early subacute infarction. 2 punctate foci are also present within the left occipital lobe. No associated mass effect. There are several foci of susceptibility hypointensity scattered throughout the region of infarction compatible with petechial hemorrhage. No extra-axial collection, hydrocephalus, effacement of basilar cisterns, or herniation. Mild volume loss of the brain. Vascular: Normal flow voids. Skull and upper cervical spine: Normal marrow signal. Sinuses/Orbits: Mild diffuse paranasal sinus mucosal thickening and large maxillary sinus mucous retention cyst. Aerosolized secretions within the right maxillary sinus may represent acute sinusitis. No abnormal signal of the mastoid air cells. Other: None. IMPRESSION: 1. Several foci of acute/early subacute infarction are present throughout the left MCA distribution predominantly posteriorly. Positive for petechial hemorrhage. No mass effect. 2. Mild volume loss of the brain. 3. Paranasal sinus disease with right maxillary aerosolized secretions which may represent acute sinusitis. These results will be called to the ordering clinician or representative by the Radiologist Assistant, and communication documented in the PACS or zVision Dashboard. Electronically Signed   By: Mitzi Hansen M.D.   On: 03/15/2018 23:02   Ct Head Code Stroke Wo Contrast  Result Date: 03/14/2018 CLINICAL DATA:  Code stroke. RIGHT-sided numbness for 3 hours. RIGHT upper extremity weakness. EXAM: CT HEAD WITHOUT CONTRAST TECHNIQUE: Contiguous axial images were obtained from the base of the skull through the vertex without intravenous contrast.  COMPARISON:  None. FINDINGS: Brain: No evidence for acute infarction, hemorrhage, mass lesion, hydrocephalus, or extra-axial fluid. Mild premature cerebral and cerebellar atrophy, with ventricular prominence particularly of the fourth ventricle reflecting vermian brain substance loss. Vascular: No hyperdense vessel or unexpected calcification. Skull: Normal. Negative for fracture or focal lesion. Scalp sebaceous cysts. Sinuses/Orbits: Significant opacity of the RIGHT maxillary sinus. Small retention cyst in the LEFT maxillary sinus. Frontal sinuses hypoplastic. Ethmoid sphenoid sinuses unremarkable. Dense lenticular opacities. Other: None. ASPECTS [facility] Stroke Program Early CT Score) - Ganglionic level infarction (caudate, lentiform nuclei, internal capsule, insula, M1-M3 cortex): 7 - Supraganglionic infarction (M4-M6 cortex): 3 Total score (0-10 with 10 being normal): 10 IMPRESSION: 1. Atrophy.  No evidence of acute stroke. 2. ASPECTS is 10. These results were called by telephone at the time of interpretation on 03/14/2018 at 7:36 pm to Dr. Samuel Jester , who verbally acknowledged these results. Electronically Signed   By: Elsie Stain M.D.   On: 03/14/2018 19:42     CBC Recent Labs  Lab 03/14/18 1912  03/15/18 0428 03/16/18 0002 03/17/18 0612 03/18/18 0418 03/19/18 0617  WBC 8.4  --  7.8 8.8 6.5 9.7 6.4  HGB 12.5*   < > 12.1* 11.8* 12.3* 11.8* 12.1*  HCT 38.8*   < >  40.4 39.6 41.1 39.8 40.1  PLT 339  --  332 349 329 304 304  MCV 70.9*  --  72.3* 72.1* 72.0* 73.2* 72.1*  MCH 22.9*  --  21.6* 21.5* 21.5* 21.7* 21.8*  MCHC 32.2  --  30.0 29.8* 29.9* 29.6* 30.2  RDW 13.5  --  13.5 13.5 13.3 13.2 13.2  LYMPHSABS 2.6  --   --   --   --   --   --   MONOABS 0.6  --   --   --   --   --   --   EOSABS 0.3  --   --   --   --   --   --   BASOSABS 0.0  --   --   --   --   --   --    < > = values in this interval not displayed.    Chemistries  Recent Labs  Lab 03/14/18 1912 03/14/18 1921  03/15/18 0428  NA 134* 135  --   K 4.7 4.7  --   CL 102 101  --   CO2 24  --   --   GLUCOSE 328* 324*  --   BUN 16 17  --   CREATININE 0.90 0.80 0.73  CALCIUM 9.1  --   --   AST 21  --   --   ALT 23  --   --   ALKPHOS 96  --   --   BILITOT 0.4  --   --    ------------------------------------------------------------------------------------------------------------------ No results for input(s): CHOL, HDL, LDLCALC, TRIG, CHOLHDL, LDLDIRECT in the last 72 hours.  Lab Results  Component Value Date   HGBA1C 10.9 (H) 03/15/2018   ------------------------------------------------------------------------------------------------------------------ No results for input(s): TSH, T4TOTAL, T3FREE, THYROIDAB in the last 72 hours.  Invalid input(s): FREET3 ------------------------------------------------------------------------------------------------------------------ No results for input(s): VITAMINB12, FOLATE, FERRITIN, TIBC, IRON, RETICCTPCT in the last 72 hours.  Coagulation profile Recent Labs  Lab 03/14/18 1912 03/17/18 0612 03/18/18 0418 03/19/18 0617  INR 0.99 1.02 1.06 1.18    No results for input(s): DDIMER in the last 72 hours.  Cardiac Enzymes No results for input(s): CKMB, TROPONINI, MYOGLOBIN in the last 168 hours.  Invalid input(s): CK ------------------------------------------------------------------------------------------------------------------ No results found for: BNP   Shon Hale M.D on 03/19/2018 at 2:09 PM  Pager---[phone] Go to [email or web address] - password [email or web address] for contact info  [facility] - Office  [phone]

## 2018-03-20 LAB — CBC
HCT: 39.7 % (ref 39.0–52.0)
HEMOGLOBIN: 11.8 g/dL — AB (ref 13.0–17.0)
MCH: 21.6 pg — ABNORMAL LOW (ref 26.0–34.0)
MCHC: 29.7 g/dL — AB (ref 30.0–36.0)
MCV: 72.7 fL — ABNORMAL LOW (ref 78.0–100.0)
PLATELETS: 326 10*3/uL (ref 150–400)
RBC: 5.46 MIL/uL (ref 4.22–5.81)
RDW: 13.2 % (ref 11.5–15.5)
WBC: 6.6 10*3/uL (ref 4.0–10.5)

## 2018-03-20 LAB — PROTIME-INR
INR: 1.36
Prothrombin Time: 16.6 seconds — ABNORMAL HIGH (ref 11.4–15.2)

## 2018-03-20 LAB — GLUCOSE, CAPILLARY
GLUCOSE-CAPILLARY: 148 mg/dL — AB (ref 70–99)
GLUCOSE-CAPILLARY: 152 mg/dL — AB (ref 70–99)
Glucose-Capillary: 198 mg/dL — ABNORMAL HIGH (ref 70–99)
Glucose-Capillary: 159 mg/dL — ABNORMAL HIGH (ref 70–99)

## 2018-03-20 MED ORDER — WARFARIN SODIUM 7.5 MG PO TABS: 7.5000 mg | ORAL_TABLET | Freq: Every day | ORAL | 2 refills | Status: AC

## 2018-03-20 MED ORDER — BLOOD GLUCOSE METER KIT: PACK | 3 refills | Status: AC

## 2018-03-20 MED ORDER — INSULIN NPH ISOPHANE & REGULAR (70-30) 100 UNIT/ML ~~LOC~~ SUSP: 10.0000 [IU] | Freq: Two times a day (BID) | SUBCUTANEOUS | 2 refills | Status: AC

## 2018-03-20 MED ORDER — METFORMIN HCL 1000 MG PO TABS: 1000.0000 mg | ORAL_TABLET | Freq: Two times a day (BID) | ORAL | 2 refills | Status: AC

## 2018-03-20 MED ORDER — WARFARIN SODIUM 7.5 MG PO TABS
12.5000 mg | ORAL_TABLET | Freq: Once | ORAL | Status: AC
  Administered 2018-03-20: 12.5 mg via ORAL
  Filled 2018-03-20: qty 1

## 2018-03-20 MED ORDER — ENOXAPARIN SODIUM 80 MG/0.8ML ~~LOC~~ SOLN: 75.0000 mg | Freq: Two times a day (BID) | SUBCUTANEOUS | 0 refills | Status: AC

## 2018-03-20 MED ORDER — ATORVASTATIN CALCIUM 80 MG PO TABS: 80.0000 mg | ORAL_TABLET | Freq: Every day | ORAL | 1 refills | Status: AC

## 2018-03-20 NOTE — Progress Notes (Signed)
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 Penicillins Anaphylaxis  . Aspirin   . Lisinopril     REACTION: Questionable angioedema  to lisinopril    Patient Measurements: Height: 5\' 6"  (167.6 cm) Weight: 165 lb (74.8 kg) IBW/kg (Calculated) : 63.8 Heparin Dosing Weight: 75kg  Vital Signs: Temp: 98.1 F (36.7 C) (09/27 0820) Temp Source: Oral (09/27 0820) BP: 97/60 (09/27 0820) Pulse Rate: 87 (09/27 0820)  Labs: Recent Labs    03/18/18 0418 03/19/18 0617 03/20/18 0529  HGB 11.8* 12.1* 11.8*  HCT 39.8 40.1 39.7  PLT 304 304 326  LABPROT 13.7 14.9 16.6*  INR 1.06 1.18 1.36    Estimated Creatinine Clearance: 91.9 mL/min (by C-G formula based on SCr of 0.73 mg/dL).    Assessment: Rodney Mcgee was recently admitted for right arm weakness. His CT showed carotid dissection that led to CVA. Neurology initially recommended low dose anticoagulation with IV heparin then transition to coumadin.  Heparin was started on 9/22.  Seen by neurology 9/23 who now recommends change to Lovenox >> Coumadin in order to facilitate discharge home.  INR 1.36 (no movement)  Goal of Therapy:  INR 2-3 Monitor platelets by anticoagulation protocol: Yes   Plan:  Lovenox 75mg  SQ q12h  Warfarin 12.5 mg po x 1 tonight Daily INR, s/s bleeding  Thank you Okey Regal, PharmD [phone] Please utilize Amion for appropriate phone number to reach the unit pharmacist [facility])    03/20/2018 11:33 AM

## 2018-03-20 NOTE — Care Management (Signed)
Pt's only form of transportation back to [facility] is on Saturday. Pt will not have a therapeutic INR by then. CM was able to obtain him an appointment at [facility] clinic in [place], [place] on Monday at 11 am for a coumadin check. They will also see him for follow up in their clinic for PCP. Information provided to the patient and placed on the AVS. Pt to d/c with lovenox and coumadin bridge. Pt without insurance. CM provided a MATCH letter and got his medications for d/c filled and provided to the charge RN on the unit for him for d/c on Saturday.  Pt provided in d/c packet the orders for outpatient OT. He can follow up with [facility] IM clinic to see where he should go for the outpatient therapy.

## 2018-03-20 NOTE — Progress Notes (Addendum)
Occupational Therapy Treatment Patient Details Name: Rodney Mcgee MRN: [id number] DOB: 1961/04/14 Today's Date: 03/20/2018    History of present illness  Rodney Mcgee  is a 57 y.o. male, with history of diabetes mellitus, hypertension, hyperlipidemia, tobacco abuse came to hospital with right hand numbness.  Patient says that he woke up after a nap and noticed numbness of the right upper extremity especially right hand.  He was unable to hold anything with the right hand.  He denies any weakness or numbness in the leg. 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  Subtle hypoattenuation of left frontal gyrus which could represent acute infarction.   OT comments  Patient continues to be limited by decreased coordination of distal R dominant UE.  Session focused on R wrist and forearm strengthening, with HEP provided for AROM of wrist flexion/extension, supination/pronation, ulnar/radial deviation and hand ab/addcution.  Patient return demonstrated PROM and self stretch of R forearm/wrist due to noted tone.  Reviewed ADL 1 handed techniques and compensatory techniques as patient plants to dc home tomorrow.  DC plan remains appropriate.    Follow Up Recommendations  Outpatient OT    Equipment Recommendations  None recommended by OT    Recommendations for Other Services      Precautions / Restrictions Precautions Precautions: None Restrictions Weight Bearing Restrictions: No       Mobility Bed Mobility Overal bed mobility: Independent                Transfers                      Balance Overall balance assessment: Independent                                         ADL either performed or assessed with clinical judgement   ADL Overall ADL's : Needs assistance/impaired     Grooming: Sitting;Modified  independent Grooming Details (indicate cue type and reason): using R hand as gross assist  Upper Body Bathing: Standing;Modified independent Upper Body Bathing Details (indicate cue type and reason): reports using R hand as able to wash     Upper Body Dressing : Supervision/safety;Sitting;Cueing for compensatory techniques Upper Body Dressing Details (indicate cue type and reason): reviewed compensatory techniques for UB dressing  Lower Body Dressing: Supervision/safety;Sit to/from stand Lower Body Dressing Details (indicate cue type and reason): donned/doffed R sock with supervision, given increased time and using 1 handed technique; reviewed use of elastic waisted pants and slip on shoes (or keeps shoes tied) Toilet Transfer: Independent;Ambulation             General ADL Comments: session focused on R UE neuro re-education      Vision       Perception     Praxis      Cognition Arousal/Alertness: Awake/alert Behavior During Therapy: WFL for tasks assessed/performed Overall Cognitive Status: Within Functional Limits for tasks assessed                                          Exercises Exercises: Other exercises Other Exercises Other Exercises: focused on exercises of R wrist: wrist extension/flexion, radial/ulnar deviation,  supination/pronation x 10 reps 1 set and provided HEP (tapping of wrist extensors in order to increase AROM ) Other Exercises: educated with pt return demosntrating self ROM and stretch to forearm and wrist  Other exercises: PROM and progressing stretch of R forearm and wrist   Shoulder Instructions       General Comments      Pertinent Vitals/ Pain       Pain Assessment: No/denies pain  Home Living                                          Prior Functioning/Environment              Frequency  Min 3X/week        Progress Toward Goals  OT Goals(current goals can now be found in the care plan  section)  Progress towards OT goals: Progressing toward goals  Acute Rehab OT Goals Patient Stated Goal: Hopes for R hand to get better for return to work OT Goal Formulation: With patient Time For Goal Achievement: 03/29/18 Potential to Achieve Goals: Good  Plan Discharge plan remains appropriate    Co-evaluation                 AM-PAC PT "6 Clicks" Daily Activity     Outcome Measure   Help from another person eating meals?: None(using L hand ) Help from another person taking care of personal grooming?: A Little Help from another person toileting, which includes using toliet, bedpan, or urinal?: None Help from another person bathing (including washing, rinsing, drying)?: None Help from another person to put on and taking off regular upper body clothing?: None Help from another person to put on and taking off regular lower body clothing?: A Little 6 Click Score: 22    End of Session    OT Visit Diagnosis: Other symptoms and signs involving the nervous system (R29.898);Hemiplegia and hemiparesis;Muscle weakness (generalized) (M62.81) Hemiplegia - Right/Left: Right Hemiplegia - dominant/non-dominant: Dominant Hemiplegia - caused by: Cerebral infarction   Activity Tolerance Patient tolerated treatment well   Patient Left in bed;with call bell/phone within reach   Nurse Communication Mobility status        Time: 1308-6578 OT Time Calculation (min): 20 min  Charges: OT General Charges $OT Visit: 1 Visit OT Treatments $Neuromuscular Re-education: 8-22 mins  Chancy Milroy, OT Acute Rehabilitation Services Pager [phone] Office [phone]    Chancy Milroy 03/20/2018, 1:26 PM

## 2018-03-20 NOTE — Progress Notes (Signed)
Patient Demographics:    Rodney Mcgee, is a 57 y.o. male, DOB - July 22, 1960, ZOX:[id number]  Admit date - 03/14/2018   Admitting Physician Lorretta Harp, MD  Outpatient Primary MD for the patient is Patient, No Pcp Per  LOS - 5   Chief Complaint  Patient presents with  . Numbness    right arm        Subjective:    [name] today has no fevers, no emesis,  No chest pain, no headaches no blurry vision right upper extremity weakness slowly improving,   Assessment  & Plan :    Active Problems:   Stroke (cerebrum) (HCC)  Brain MRI: 1. Several foci of acute/early subacute infarction are present throughout the left MCA distribution predominantly posteriorly. Positive for petechial hemorrhage. No mass effect. 2. Mild volume loss of the brain.   Brief Summary 57 year old male, resident of [place] who was visiting family in [place], [place], works in the Psychologist, forensic at [employer], PMH of DM 2, HTN, HLD, tobacco abuse presented initially to [facility] with sudden onset of right upper extremity numbness and weakness without any other stroke like symptoms admitted 03/15/18 from [facility] to [facility] with CTA Head/Neck showing carotid Artery dissection with intramural hematoma.  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  As per neurology, 3 months anticoagulation.  Now on Lovenox bridge and Coumadin  3)HTN--- overall stable BP,, avoid  over aggressive BP control, allow some degree of permissive hypertension  4)DM2-poorly controlled, A1c 10.9, right upper extremity weakness makes it difficult for patient to self inject insulin, 70/30 insulin 2 units twice daily with meals, Use Novolog/Humalog Sliding scale insulin with Accu-Cheks/Fingersticks as ordered   5)Tobacco Abuse-smoking cessation advised  Disposition/Need for in-Hospital Stay- patient unable to be discharged at this time due   [place] is from [place] of state/[place] .  Patient currently states that he does not have a ride back home to [place] until 03/21/18 , D/w Harvin Hazel from case management ,  discharge on Saturday, 03/21/2018 as patient states that he can get somebody from [place] to pick him up 03/21/18  Discharge plan for 03/21/2018, patient girlfriend will drive from [place] to pick him up sometime on 03/21/18 appointment has been made at Coumadin clinic in [facility] in [place], [place] on Monday 03/23/18 at 11 am for a coumadin check, They will also see him for follow up in their clinic for PCP. Information provided to the patient and placed on the AVS. Pt to d/c with lovenox and coumadin bridge. Pt without insurance. CM provided a MATCH letter and got his medications for d/c filled and provided to the charge RN on the unit for him for d/c on Saturday.  Pt provided in d/c packet the orders  for outpatient OT. He can follow up with [facility] IM clinic to see where he should go for the outpatient therapyOkay to discharge on 03/21/2018 when patient arrived at this year even if INR is not therapeutic patient has been prescribed Lovenox and Coumadin provided to him by case management services as of 03/20/2018    Code Status : FULL    Disposition Plan  : home  Consults  : Neurology/case management/physical therapy   DVT Prophylaxis  :  Lovenox   Lab Results  Component Value Date   PLT 326 03/20/2018    Inpatient  Medications  Scheduled Meds: . atorvastatin  80 mg Oral q1800  . enoxaparin (LOVENOX) injection  1 mg/kg Subcutaneous Q12H  . insulin aspart  0-5 Units Subcutaneous QHS  . insulin aspart  0-9 Units Subcutaneous TID WC  . insulin aspart protamine- aspart  10 Units Subcutaneous BID WC  . Warfarin - Pharmacist Dosing Inpatient   Does not apply q1800   Continuous Infusions: . sodium chloride 10 mL/hr at 03/15/18 1500   PRN Meds:.acetaminophen **OR** acetaminophen (TYLENOL) oral liquid 160 mg/5 mL **OR** acetaminophen    Anti-infectives (From admission, onward)   None        Objective:   Vitals:   03/20/18 0820 03/20/18 1221 03/20/18 1620 03/20/18 1937  BP: 97/60 127/77 (!) 151/81 (!) 149/84  Pulse: 87 84 91 91  Resp: 19 18 19 18   Temp: 98.1 F (36.7 C) 98.4 F (36.9 C) 98.5 F (36.9 C) 97.9 F (36.6 C)  TempSrc: Oral Oral Oral Oral  SpO2: 99% 100% 98% 100%  Weight:      Height:        Wt Readings from Last 3 Encounters:  03/14/18 74.8 kg  09/06/14 81.6 kg  08/25/14 83.8 kg     Intake/Output Summary (Last 24 hours) at 03/20/2018 1955 Last data filed at 03/20/2018 0200 Gross per 24 hour  Intake 500 ml  Output 400 ml  Net 100 ml     Physical Exam Patient is examined daily including today on 03/20/18 , exams remain the same as of yesterday except that has changed   Gen:- Awake Alert, no acute distress HEENT:- [place].AT, No sclera icterus Mouth-poor oral hygiene/dentition Neck-Supple Neck,No JVD,.  Lungs-  CTAB , good air movement CV- S1, S2 normal, regular Abd-  +ve B.Sounds, Abd Soft, No tenderness,    Extremity/Skin:- No  edema,   good pulses Psych-affect is appropriate, oriented x3 Neuro-right upper extremity weakness noted, right-sided facial numbness appears to be improving   Data Review:   Micro Results No results found for this or any previous visit (from the past 240 hour(s)).  Radiology Reports Ct Angio Head W Or Wo Contrast  Result Date:  03/14/2018 CLINICAL DATA:  RIGHT upper extremity weakness and numbness. Onset earlier today. EXAM: CT ANGIOGRAPHY HEAD AND NECK TECHNIQUE: Multidetector CT imaging of the head and neck was performed using the standard protocol during bolus administration of intravenous contrast. Multiplanar CT image reconstructions and MIPs were obtained to evaluate the vascular anatomy. Carotid stenosis measurements (when applicable) are obtained utilizing NASCET criteria, using the distal internal carotid diameter as the denominator. CONTRAST:  75mL ISOVUE-370 IOPAMIDOL (ISOVUE-370) INJECTION 76% COMPARISON:  Noncontrast CT head at 16:27 hours FINDINGS: CTA NECK FINDINGS Aortic arch: Bovine trunk. Imaged portion shows no evidence of aneurysm or dissection. No significant stenosis of the major arch vessel origins. Right carotid system: No evidence of dissection, stenosis (50% or greater) or occlusion. Left carotid  system: There is a long segment of dissection involving the mid common carotid artery on the LEFT with intraluminal thrombus. The dissection extends over 4 cm, and appears to terminate 2 cm below the bifurcation. Estimated 75% stenosis based on luminal measurements of 1.2/4.7 mm. Carotid bifurcation shows only minor atheromatous change. No cervical ICA disease. Vertebral arteries: LEFT vertebral dominant. RIGHT vertebral diminutive with atheromatous change at its origin. No ostial stenosis on the LEFT. Skeleton: Spondylosis.  No worrisome osseous lesion. Other neck: No neck masses. Upper chest: No pneumothorax or mass. Review of the MIP images confirms the above findings CTA HEAD FINDINGS Anterior circulation: Nonstenotic atheromatous change of the petrous and cavernous ICA segments with calcifications. ICA termini widely patent. No M1 or A1 stenosis of significance. No M2 or M3 branch irregularity. Posterior circulation: No significant stenosis, proximal occlusion, aneurysm, or vascular malformation. Venous sinuses: As  permitted by contrast timing, patent. Anatomic variants: None of significance.  Fetal LEFT PCA. Delayed phase: There could be developing hypodensity of a LEFT frontal gyrus near the convexity, see image 26 series 12; this was not clearly present on earlier CT. No abnormal enhancement is observed. 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 No intracranial emboli are identified, but there is subtle hypoattenuation of a LEFT frontal gyrus, which could represent acute infarction. MRI brain recommended for further evaluation. Nonstenotic intracranial atherosclerotic change of the BILATERAL skull base internal carotid arteries. No evidence for emergent large vessel occlusion. These results were called by telephone at the time of interpretation on 03/14/2018 at 9:44 pm to Dr. Clarene Duke, Who verbally acknowledged these results. Electronically Signed   By: Elsie Stain M.D.   On: 03/14/2018 21:52   Dg Chest 2 View  Result Date: 03/15/2018 CLINICAL DATA:  57 year old male with stroke. EXAM: CHEST - 2 VIEW COMPARISON:  Chest radiograph dated 08/20/2013 FINDINGS: The heart size and mediastinal contours are within normal limits. Both lungs are clear. The visualized skeletal structures are unremarkable. IMPRESSION: No active cardiopulmonary disease. Electronically Signed   By: Elgie Collard M.D.   On: 03/15/2018 01:34   Ct Angio Neck W Or Wo Contrast  Result Date: 03/14/2018 CLINICAL DATA:  RIGHT upper extremity weakness and numbness. Onset earlier today. EXAM: CT ANGIOGRAPHY HEAD AND NECK TECHNIQUE: Multidetector CT imaging of the head and neck was performed using the standard protocol during bolus administration of intravenous contrast. Multiplanar CT image reconstructions and MIPs were obtained to evaluate the vascular anatomy. Carotid stenosis measurements  (when applicable) are obtained utilizing NASCET criteria, using the distal internal carotid diameter as the denominator. CONTRAST:  75mL ISOVUE-370 IOPAMIDOL (ISOVUE-370) INJECTION 76% COMPARISON:  Noncontrast CT head at 16:27 hours FINDINGS: CTA NECK FINDINGS Aortic arch: Bovine trunk. Imaged portion shows no evidence of aneurysm or dissection. No significant stenosis of the major arch vessel origins. Right carotid system: No evidence of dissection, stenosis (50% or greater) or occlusion. Left carotid system: There is a long segment of dissection involving the mid common carotid artery on the LEFT with intraluminal thrombus. The dissection extends over 4 cm, and appears to terminate 2 cm below the bifurcation. Estimated 75% stenosis based on luminal measurements of 1.2/4.7 mm. Carotid bifurcation shows only minor atheromatous change. No cervical ICA disease. Vertebral arteries: LEFT vertebral dominant. RIGHT vertebral diminutive with atheromatous change at its origin. No ostial stenosis on the LEFT. Skeleton: Spondylosis.  No  worrisome osseous lesion. Other neck: No neck masses. Upper chest: No pneumothorax or mass. Review of the MIP images confirms the above findings CTA HEAD FINDINGS Anterior circulation: Nonstenotic atheromatous change of the petrous and cavernous ICA segments with calcifications. ICA termini widely patent. No M1 or A1 stenosis of significance. No M2 or M3 branch irregularity. Posterior circulation: No significant stenosis, proximal occlusion, aneurysm, or vascular malformation. Venous sinuses: As permitted by contrast timing, patent. Anatomic variants: None of significance.  Fetal LEFT PCA. Delayed phase: There could be developing hypodensity of a LEFT frontal gyrus near the convexity, see image 26 series 12; this was not clearly present on earlier CT. No abnormal enhancement is observed. Review of the MIP images confirms the above findings IMPRESSION: Long segment LEFT common carotid artery  dissection, extending over approximately 4 cm, with intraluminal thrombus, estimated 75% stenosis. No intracranial emboli are identified, but there is subtle hypoattenuation of a LEFT frontal gyrus, which could represent acute infarction. MRI brain recommended for further evaluation. Nonstenotic intracranial atherosclerotic change of the BILATERAL skull base internal carotid arteries. No evidence for emergent large vessel occlusion. These results were called by telephone at the time of interpretation on 03/14/2018 at 9:44 pm to Dr. Clarene Duke, Who verbally acknowledged these results. Electronically Signed   By: Elsie Stain M.D.   On: 03/14/2018 21:52   Mr Brain Wo Contrast  Result Date: 03/15/2018 CLINICAL DATA:  57 y/o M; sudden onset of right upper extremity numbness and weakness. EXAM: MRI HEAD WITHOUT CONTRAST TECHNIQUE: Multiplanar, multiecho pulse sequences of the brain and surrounding structures were obtained without intravenous contrast. COMPARISON:  03/14/2018 CT head and CTA head. FINDINGS: Brain: Several foci of reduced diffusion are present throughout the left MCA distribution, predominantly posteriorly, compatible with acute/early subacute infarction. 2 punctate foci are also present within the left occipital lobe. No associated mass effect. There are several foci of susceptibility hypointensity scattered throughout the region of infarction compatible with petechial hemorrhage. No extra-axial collection, hydrocephalus, effacement of basilar cisterns, or herniation. Mild volume loss of the brain. Vascular: Normal flow voids. Skull and upper cervical spine: Normal marrow signal. Sinuses/Orbits: Mild diffuse paranasal sinus mucosal thickening and large maxillary sinus mucous retention cyst. Aerosolized secretions within the right maxillary sinus may represent acute sinusitis. No abnormal signal of the mastoid air cells. Other: None. IMPRESSION: 1. Several foci of acute/early subacute infarction are  present throughout the left MCA distribution predominantly posteriorly. Positive for petechial hemorrhage. No mass effect. 2. Mild volume loss of the brain. 3. Paranasal sinus disease with right maxillary aerosolized secretions which may represent acute sinusitis. These results will be called to the ordering clinician or representative by the Radiologist Assistant, and communication documented in the PACS or zVision Dashboard. Electronically Signed   By: Mitzi Hansen M.D.   On: 03/15/2018 23:02   Ct Head Code Stroke Wo Contrast  Result Date: 03/14/2018 CLINICAL DATA:  Code stroke. RIGHT-sided numbness for 3 hours. RIGHT upper extremity weakness. EXAM: CT HEAD WITHOUT CONTRAST TECHNIQUE: Contiguous axial images were obtained from the base of the skull through the vertex without intravenous contrast. COMPARISON:  None. FINDINGS: Brain: No evidence for acute infarction, hemorrhage, mass lesion, hydrocephalus, or extra-axial fluid. Mild premature cerebral and cerebellar atrophy, with ventricular prominence particularly of the fourth ventricle reflecting vermian brain substance loss. Vascular: No hyperdense vessel or unexpected calcification. Skull: Normal. Negative for fracture or focal lesion. Scalp sebaceous cysts. Sinuses/Orbits: Significant opacity of the RIGHT maxillary sinus. Small retention cyst in the  LEFT maxillary sinus. Frontal sinuses hypoplastic. Ethmoid sphenoid sinuses unremarkable. Dense lenticular opacities. Other: None. ASPECTS [facility] Stroke Program Early CT Score) - Ganglionic level infarction (caudate, lentiform nuclei, internal capsule, insula, M1-M3 cortex): 7 - Supraganglionic infarction (M4-M6 cortex): 3 Total score (0-10 with 10 being normal): 10 IMPRESSION: 1. Atrophy.  No evidence of acute stroke. 2. ASPECTS is 10. These results were called by telephone at the time of interpretation on 03/14/2018 at 7:36 pm to Dr. Samuel Jester , who verbally acknowledged these results.  Electronically Signed   By: Elsie Stain M.D.   On: 03/14/2018 19:42     CBC Recent Labs  Lab 03/14/18 1912  03/16/18 0002 03/17/18 0612 03/18/18 0418 03/19/18 0617 03/20/18 0529  WBC 8.4   < > 8.8 6.5 9.7 6.4 6.6  HGB 12.5*   < > 11.8* 12.3* 11.8* 12.1* 11.8*  HCT 38.8*   < > 39.6 41.1 39.8 40.1 39.7  PLT 339   < > 349 329 304 304 326  MCV 70.9*   < > 72.1* 72.0* 73.2* 72.1* 72.7*  MCH 22.9*   < > 21.5* 21.5* 21.7* 21.8* 21.6*  MCHC 32.2   < > 29.8* 29.9* 29.6* 30.2 29.7*  RDW 13.5   < > 13.5 13.3 13.2 13.2 13.2  LYMPHSABS 2.6  --   --   --   --   --   --   MONOABS 0.6  --   --   --   --   --   --   EOSABS 0.3  --   --   --   --   --   --   BASOSABS 0.0  --   --   --   --   --   --    < > = values in this interval not displayed.    Chemistries  Recent Labs  Lab 03/14/18 1912 03/14/18 1921 03/15/18 0428  NA 134* 135  --   K 4.7 4.7  --   CL 102 101  --   CO2 24  --   --   GLUCOSE 328* 324*  --   BUN 16 17  --   CREATININE 0.90 0.80 0.73  CALCIUM 9.1  --   --   AST 21  --   --   ALT 23  --   --   ALKPHOS 96  --   --   BILITOT 0.4  --   --    ------------------------------------------------------------------------------------------------------------------ No results for input(s): CHOL, HDL, LDLCALC, TRIG, CHOLHDL, LDLDIRECT in the last 72 hours.  Lab Results  Component Value Date   HGBA1C 10.9 (H) 03/15/2018   ------------------------------------------------------------------------------------------------------------------ No results for input(s): TSH, T4TOTAL, T3FREE, THYROIDAB in the last 72 hours.  Invalid input(s): FREET3 ------------------------------------------------------------------------------------------------------------------ No results for input(s): VITAMINB12, FOLATE, FERRITIN, TIBC, IRON, RETICCTPCT in the last 72 hours.  Coagulation profile Recent Labs  Lab 03/14/18 1912 03/17/18 0612 03/18/18 0418 03/19/18 0617 03/20/18 0529  INR  0.99 1.02 1.06 1.18 1.36    No results for input(s): DDIMER in the last 72 hours.  Cardiac Enzymes No results for input(s): CKMB, TROPONINI, MYOGLOBIN in the last 168 hours.  Invalid input(s): CK ------------------------------------------------------------------------------------------------------------------ No results found for: BNP   Shon Hale M.D on 03/20/2018 at 7:55 PM  Pager---[phone] Go to [email or web address] - password [email or web address] for contact info  [facility] - Office  [phone]

## 2018-03-21 LAB — GLUCOSE, CAPILLARY
GLUCOSE-CAPILLARY: 139 mg/dL — AB (ref 70–99)
Glucose-Capillary: 199 mg/dL — ABNORMAL HIGH (ref 70–99)

## 2018-03-21 LAB — CBC
HCT: 39 % (ref 39.0–52.0)
Hemoglobin: 11.8 g/dL — ABNORMAL LOW (ref 13.0–17.0)
MCH: 21.9 pg — ABNORMAL LOW (ref 26.0–34.0)
MCHC: 30.3 g/dL (ref 30.0–36.0)
MCV: 72.5 fL — AB (ref 78.0–100.0)
PLATELETS: 326 10*3/uL (ref 150–400)
RBC: 5.38 MIL/uL (ref 4.22–5.81)
RDW: 13.4 % (ref 11.5–15.5)
WBC: 6.4 10*3/uL (ref 4.0–10.5)

## 2018-03-21 LAB — PROTIME-INR
INR: 1.66
PROTHROMBIN TIME: 19.5 s — AB (ref 11.4–15.2)

## 2018-03-21 MED ORDER — WARFARIN SODIUM 5 MG PO TABS: 10.0000 mg | ORAL_TABLET | Freq: Once | ORAL | Status: DC

## 2018-03-21 NOTE — Progress Notes (Signed)
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 Penicillins Anaphylaxis  . Aspirin   . Lisinopril     REACTION: Questionable angioedema  to lisinopril    Patient Measurements: Height: 5\' 6"  (167.6 cm) Weight: 165 lb (74.8 kg) IBW/kg (Calculated) : 63.8 Heparin Dosing Weight: 75kg  Vital Signs: Temp: 97.2 F (36.2 C) (09/28 0749) Temp Source: Oral (09/28 0749) BP: 129/71 (09/28 0749) Pulse Rate: 85 (09/28 0749)  Labs: Recent Labs    03/19/18 0617 03/20/18 0529 03/21/18 0501  HGB 12.1* 11.8* 11.8*  HCT 40.1 39.7 39.0  PLT 304 326 326  LABPROT 14.9 16.6* 19.5*  INR 1.18 1.36 1.66    Estimated Creatinine Clearance: 91.9 mL/min (by C-G formula based on SCr of 0.73 mg/dL).    Assessment: Rodney Mcgee was recently admitted for right arm weakness. His CT showed carotid dissection that led to CVA. Neurology initially recommended low dose anticoagulation with IV heparin then transition to coumadin.  Heparin was started on 9/22.  Seen by neurology 9/23 who now recommends change to Lovenox >> Coumadin in order to facilitate discharge home.  INR remains subtherapeutic today at 1.66. Hgb and plts stable, no bleeding reported.   Goal of Therapy:  INR 2-3 Monitor platelets by anticoagulation protocol: Yes   Plan:  Continue Lovenox 75mg  SQ q12h  Warfarin 10 mg po x 1 tonight Daily INR, s/s bleeding, CBC   Thank you Lenward Chancellor, PharmD PGY1 Pharmacy Resident Phone [phone] 03/21/2018 8:23 AM

## 2018-03-21 NOTE — Progress Notes (Signed)
Nsg Discharge Note  Admit Date:  03/14/2018 Discharge date: 03/21/2018   Herbie [place] L Cadogan to be D/C'd Home per MD order.  AVS completed.  Copy for chart, and copy for patient signed, and dated. Patient/caregiver able to verbalize understanding.  Discharge Medication: Allergies as of 03/21/2018      Reactions   Penicillins Anaphylaxis   Aspirin    Lisinopril    REACTION: Questionable angioedema  to lisinopril      Medication List    STOP taking these medications   glyBURIDE 5 MG tablet Commonly known as:  DIABETA     TAKE these medications   atorvastatin 80 MG tablet Commonly known as:  LIPITOR Take 1 tablet (80 mg total) by mouth daily at 6 PM.   blood glucose meter kit and supplies Relion Prime or Dispense other brand based on patient and insurance preference. Use up to four times daily as directed. (FOR ICD-9 250.00, 250.01).   enoxaparin 80 MG/0.8ML injection Commonly known as:  LOVENOX Inject 0.75 mLs (75 mg total) into the skin every 12 (twelve) hours.   glucose blood test strip Use as instructed   insulin NPH-regular Human (70-30) 100 UNIT/ML injection Commonly known as:  NOVOLIN 70/30 Inject 10 Units into the skin 2 (two) times daily with a meal.   metFORMIN 1000 MG tablet Commonly known as:  GLUCOPHAGE Take 1 tablet (1,000 mg total) by mouth 2 (two) times daily with a meal.   warfarin 7.5 MG tablet Commonly known as:  COUMADIN Take 1 tablet (7.5 mg total) by mouth daily.       Discharge Assessment: Vitals:   03/21/18 1153 03/21/18 1523  BP: (!) 162/85 (!) 155/77  Pulse: 87 90  Resp: 17 16  Temp: (!) 97.5 F (36.4 C) 97.7 F (36.5 C)  SpO2: 100% 100%   Skin clean, dry and intact without evidence of skin break down, no evidence of skin tears noted. IV catheter discontinued intact. Site without signs and symptoms of complications - no redness or edema noted at insertion site, patient denies c/o pain - only slight tenderness at site.  Dressing with  slight pressure applied.  D/c Instructions-Education: Discharge instructions given to patient/family with verbalized understanding. Provided pt with meds. Pt verbalizes understanding how to take medications. Pt verbalizes understanding to follow up with his coumadin clinic on Monday.  D/c education completed with patient/family including follow up instructions, medication list, d/c activities limitations if indicated, with other d/c instructions as indicated by MD - patient able to verbalize understanding, all questions fully answered. Patient instructed to return to ED, call 911, or call MD for any changes in condition.  Patient escorted via [place], and D/C home via private auto.  Eda Keys, RN 03/21/2018 3:48 PM

## 2018-03-21 NOTE — Discharge Summary (Signed)
Physician Discharge Summary  Rodney Mcgee WEX:[id number] DOB: May 01, 1961 DOA: 03/14/2018  PCP: Patient, No Pcp Per  Admit date: 03/14/2018 Discharge date: 03/21/2018  Time spent: 35 minutes  Recommendations for Outpatient Follow-up:  1. Patient will need INR check which is already been set up at [facility] in [place] 2. Patient will need to discontinue Lovenox when INR is therapeutic above 2 3. Patient will need coordination of repeat CTA head and neck in 3 months and follow-up with outpatient neurologist in [facility] in 6 to 8 weeks 4. Recommend A1c and lipid panel in 3 months and aggressive metabolic syndrome control 5. Suggest titration of blood pressure meds for gradual but stringent control below 120/80 as an outpatient  Discharge Diagnoses:  Active Problems:   Stroke (cerebrum) [facility])   Discharge Condition: Improved  Diet recommendation: Diabetic heart healthy  Filed Weights   03/14/18 1853  Weight: 74.8 kg    History of present illness:  57 year old male [place] resident visiting family in [place] History of poorly controlled diabetes mellitus HTN tobacco abuse presented to [facility] with sudden right upper extremity weakness and was admitted to [facility] 9/22 with carotid dissection and intramural hematoma no history of trauma Patient was seen in consult by neurology as per below  Hospital Course:   1)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 As per neurology, 3 months anticoagulation. Now on  Lovenox bridge and Coumadin  3)HTN--- overall stable BP,, avoid over aggressive BP control, allow some degree of permissive hypertension-we will need titration of blood pressure meds and risk factor modification as an outpatient  4)DM2-poorly controlled, A1c 10.9, right upper extremity weakness makes it difficult for patient to self inject insulin, 70/30 insulin 2 units twice daily with meals, Use Novolog/Humalog Sliding scale insulin with Accu-Cheks/Fingersticks as ordered --Will need further evaluation as outpatient   5)Tobacco Abuse-smoking cessation advised  Procedures: Brain MRI: 1. Several foci of acute/early subacute infarction are present throughout the left MCA distribution predominantly posteriorly. Positive for petechial hemorrhage. No mass effect. 2. Mild volume loss of the brain.   Consultations:  Neurologist  Discharge Exam: Vitals:   03/21/18 0014 03/21/18 0350  BP: (!) 146/74 (!) 147/87  Pulse: 86 86  Resp: 18 18  Temp: 98.4 F (36.9 C) 97.9 F (36.6 C)  SpO2: 100% 97%    General: Awake alert pleasant no distress smile symmetric EOMI NCAT uvula midline shoulder shrug bilaterally equal no icterus no pallor-some deficits in the right upper extremity with grip strength and sensory loss power however remains 5/5 reflexes are slightly brisk Cardiovascular: S1-S2 no murmur rub or gallop Respiratory: Clinically clear no added sound Psych euthymic  Discharge Instructions   Discharge Instructions    Ambulatory referral to Neurology   Complete by:  As directed    Follow up with stroke clinic NP (Jessica Vanschaick or Cecille Rubin, if both not available, consider Zachery Dauer, or Ahern) at [facility] in about 4 weeks. Thanks.   Ambulatory referral to Occupational Therapy   Complete by:  As directed    Diet -  low sodium heart healthy   Complete by:  As directed    Discharge instructions   Complete by:  As directed    Follow instructions clearly about Lovenoex to  coumadin bridging and get follow-up INr as planned   Increase activity slowly   Complete by:  As directed      Allergies as of 03/21/2018      Reactions   Penicillins Anaphylaxis   Aspirin    Lisinopril    REACTION: Questionable angioedema  to lisinopril      Medication List    STOP taking these medications   glyBURIDE 5 MG tablet Commonly known as:  DIABETA     TAKE these medications   atorvastatin 80 MG tablet Commonly known as:  LIPITOR Take 1 tablet (80 mg total) by mouth daily at 6 PM.   blood glucose meter kit and supplies Relion Prime or Dispense other brand based on patient and insurance preference. Use up to four times daily as directed. (FOR ICD-9 250.00, 250.01).   enoxaparin 80 MG/0.8ML injection Commonly known as:  LOVENOX Inject 0.75 mLs (75 mg total) into the skin every 12 (twelve) hours.   glucose blood test strip Use as instructed   insulin NPH-regular Human (70-30) 100 UNIT/ML injection Commonly known as:  NOVOLIN 70/30 Inject 10 Units into the skin 2 (two) times daily with a meal.   metFORMIN 1000 MG tablet Commonly known as:  GLUCOPHAGE Take 1 tablet (1,000 mg total) by mouth 2 (two) times daily with a meal.   warfarin 7.5 MG tablet Commonly known as:  COUMADIN Take 1 tablet (7.5 mg total) by mouth daily.      Allergies  Allergen Reactions  . Penicillins Anaphylaxis  . Aspirin   . Lisinopril     REACTION: Questionable angioedema  to lisinopril   Follow-up Information    [facility]. Schedule an appointment as soon as possible for a visit in 4 week(s).   Specialty:  Radiology Contact information: [address] [place] [phone]       [facility] Follow up on 03/23/2018.   Why:  Your appointment time is 11 am with the coumadin clinic. Social work will meet with you at this appointment to get you in as a patient with the clinic. Take your d/c paper  work from the hospital and ID Contact information: [place] [address] [facility], [place], [place] [address]  [phone]           The results of significant diagnostics from this hospitalization (including imaging, microbiology, ancillary and laboratory) are listed below for reference.    Significant Diagnostic Studies: Ct Angio Head W Or Wo Contrast  Result Date: 03/14/2018 CLINICAL DATA:  RIGHT upper extremity weakness and numbness. Onset earlier today. EXAM: CT ANGIOGRAPHY HEAD AND NECK TECHNIQUE: Multidetector CT imaging of the head and neck was performed using the standard protocol during bolus administration of intravenous contrast. Multiplanar CT image reconstructions and MIPs were obtained to evaluate the vascular anatomy. Carotid stenosis measurements (when applicable) are obtained utilizing NASCET criteria, using the distal internal carotid diameter as the denominator. CONTRAST:  74m ISOVUE-370 IOPAMIDOL (ISOVUE-370) INJECTION 76% COMPARISON:  Noncontrast CT head at 16:27 hours FINDINGS: CTA NECK FINDINGS Aortic arch: Bovine trunk. Imaged portion shows no evidence of aneurysm or dissection. No significant stenosis of the major arch vessel origins. Right carotid system: No evidence of dissection, stenosis (50% or greater) or occlusion. Left  carotid system: There is a long segment of dissection involving the mid common carotid artery on the LEFT with intraluminal thrombus. The dissection extends over 4 cm, and appears to terminate 2 cm below the bifurcation. Estimated 75% stenosis based on luminal measurements of 1.2/4.7 mm. Carotid bifurcation shows only minor atheromatous change. No cervical ICA disease. Vertebral arteries: LEFT vertebral dominant. RIGHT vertebral diminutive with atheromatous change at its origin. No ostial stenosis on the LEFT. Skeleton: Spondylosis.  No worrisome osseous lesion. Other neck: No neck masses. Upper chest: No pneumothorax or mass. Review of the MIP  images confirms the above findings CTA HEAD FINDINGS Anterior circulation: Nonstenotic atheromatous change of the petrous and cavernous ICA segments with calcifications. ICA termini widely patent. No M1 or A1 stenosis of significance. No M2 or M3 branch irregularity. Posterior circulation: No significant stenosis, proximal occlusion, aneurysm, or vascular malformation. Venous sinuses: As permitted by contrast timing, patent. Anatomic variants: None of significance.  Fetal LEFT PCA. Delayed phase: There could be developing hypodensity of a LEFT frontal gyrus near the convexity, see image 26 series 12; this was not clearly present on earlier CT. No abnormal enhancement is observed. 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 No intracranial emboli are identified, but there is subtle hypoattenuation of a LEFT frontal gyrus, which could represent acute infarction. MRI brain recommended for further evaluation. Nonstenotic intracranial atherosclerotic change of the BILATERAL skull base internal carotid arteries. No evidence for emergent large vessel occlusion. These results were called by telephone at the time of interpretation on 03/14/2018 at 9:44 pm to Dr. Thurnell Garbe, Who verbally acknowledged these results. Electronically Signed   By: Staci Righter M.D.   On: 03/14/2018 21:52   Dg Chest 2 View  Result Date: 03/15/2018 CLINICAL DATA:  57 year old male with stroke. EXAM: CHEST - 2 VIEW COMPARISON:  Chest radiograph dated 08/20/2013 FINDINGS: The heart size and mediastinal contours are within normal limits. Both lungs are clear. The visualized skeletal structures are unremarkable. IMPRESSION: No active cardiopulmonary disease. Electronically Signed   By: Anner Crete M.D.   On: 03/15/2018 01:34   Ct Angio Neck W Or Wo Contrast  Result Date:  03/14/2018 CLINICAL DATA:  RIGHT upper extremity weakness and numbness. Onset earlier today. EXAM: CT ANGIOGRAPHY HEAD AND NECK TECHNIQUE: Multidetector CT imaging of the head and neck was performed using the standard protocol during bolus administration of intravenous contrast. Multiplanar CT image reconstructions and MIPs were obtained to evaluate the vascular anatomy. Carotid stenosis measurements (when applicable) are obtained utilizing NASCET criteria, using the distal internal carotid diameter as the denominator. CONTRAST:  5m ISOVUE-370 IOPAMIDOL (ISOVUE-370) INJECTION 76% COMPARISON:  Noncontrast CT head at 16:27 hours FINDINGS: CTA NECK FINDINGS Aortic arch: Bovine trunk. Imaged portion shows no evidence of aneurysm or dissection. No significant stenosis of the major arch vessel origins. Right carotid system: No evidence of dissection, stenosis (50% or greater) or occlusion. Left carotid system: There is a long segment of dissection involving the mid common carotid artery on the LEFT with intraluminal thrombus. The dissection extends over 4 cm, and appears to terminate 2 cm below the bifurcation. Estimated 75% stenosis based on luminal measurements of 1.2/4.7 mm. Carotid bifurcation shows only minor atheromatous change. No cervical ICA disease. Vertebral arteries: LEFT vertebral dominant. RIGHT vertebral diminutive with atheromatous change at its origin. No ostial stenosis on the LEFT. Skeleton: Spondylosis.  No worrisome osseous lesion. Other neck: No neck masses. Upper chest: No pneumothorax or mass. Review of the MIP images confirms the above findings CTA HEAD FINDINGS Anterior circulation: Nonstenotic atheromatous change of the petrous and cavernous ICA segments with calcifications. ICA termini widely patent. No M1 or A1 stenosis of significance. No M2 or M3 branch irregularity. Posterior circulation: No significant stenosis, proximal occlusion, aneurysm, or vascular malformation. Venous sinuses: As  permitted by contrast timing, patent. Anatomic variants: None of significance.  Fetal LEFT PCA. Delayed phase: There could be developing hypodensity of a LEFT frontal gyrus near the convexity, see image 26 series 12; this was not clearly present on earlier CT. No abnormal enhancement is observed. 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 No intracranial emboli are identified, but there is subtle hypoattenuation of a LEFT frontal gyrus, which could represent acute infarction. MRI brain recommended for further evaluation. Nonstenotic intracranial atherosclerotic change of the BILATERAL skull base internal carotid arteries. No evidence for emergent large vessel occlusion. These results were called by telephone at the time of interpretation on 03/14/2018 at 9:44 pm to Dr. Thurnell Garbe, Who verbally acknowledged these results. Electronically Signed   By: Staci Righter M.D.   On: 03/14/2018 21:52   Mr Brain Wo Contrast  Result Date: 03/15/2018 CLINICAL DATA:  56 y/o M; sudden onset of right upper extremity numbness and weakness. EXAM: MRI HEAD WITHOUT CONTRAST TECHNIQUE: Multiplanar, multiecho pulse sequences of the brain and surrounding structures were obtained without intravenous contrast. COMPARISON:  03/14/2018 CT head and CTA head. FINDINGS: Brain: Several foci of reduced diffusion are present throughout the left MCA distribution, predominantly posteriorly, compatible with acute/early subacute infarction. 2 punctate foci are also present within the left occipital lobe. No associated mass effect. There are several foci of susceptibility hypointensity scattered throughout the region of infarction compatible with petechial hemorrhage. No extra-axial collection, hydrocephalus, effacement of basilar cisterns, or herniation. Mild volume loss of the brain. Vascular:  Normal flow voids. Skull and upper cervical spine: Normal marrow signal. Sinuses/Orbits: Mild diffuse paranasal sinus mucosal thickening and large maxillary sinus mucous retention cyst. Aerosolized secretions within the right maxillary sinus may represent acute sinusitis. No abnormal signal of the mastoid air cells. Other: None. IMPRESSION: 1. Several foci of acute/early subacute infarction are present throughout the left MCA distribution predominantly posteriorly. Positive for petechial hemorrhage. No mass effect. 2. Mild volume loss of the brain. 3. Paranasal sinus disease with right maxillary aerosolized secretions which may represent acute sinusitis. These results will be called to the ordering clinician or representative by the Radiologist Assistant, and communication documented in the PACS or zVision Dashboard. Electronically Signed   By: Kristine Garbe M.D.   On: 03/15/2018 23:02   Ct Head Code Stroke Wo Contrast  Result Date: 03/14/2018 CLINICAL DATA:  Code stroke. RIGHT-sided numbness for 3 hours. RIGHT upper extremity weakness. EXAM: CT HEAD WITHOUT CONTRAST TECHNIQUE: Contiguous axial images were obtained from the base of the skull through the vertex without intravenous contrast. COMPARISON:  None. FINDINGS: Brain: No evidence for acute infarction, hemorrhage, mass lesion, hydrocephalus, or extra-axial fluid. Mild premature cerebral and cerebellar atrophy, with ventricular prominence particularly of the fourth ventricle reflecting vermian brain substance loss. Vascular: No hyperdense vessel or unexpected calcification. Skull: Normal. Negative for fracture or focal lesion. Scalp sebaceous cysts. Sinuses/Orbits: Significant opacity of the RIGHT maxillary sinus. Small retention cyst in  the LEFT maxillary sinus. Frontal sinuses hypoplastic. Ethmoid sphenoid sinuses unremarkable. Dense lenticular opacities. Other: None. ASPECTS [facility] Stroke Program Early CT Score) - Ganglionic level infarction  (caudate, lentiform nuclei, internal capsule, insula, M1-M3 cortex): 7 - Supraganglionic infarction (M4-M6 cortex): 3 Total score (0-10 with 10 being normal): 10 IMPRESSION: 1. Atrophy.  No evidence of acute stroke. 2. ASPECTS is 10. These results were called by telephone at the time of interpretation on 03/14/2018 at 7:36 pm to Dr. Francine Graven , who verbally acknowledged these results. Electronically Signed   By: Staci Righter M.D.   On: 03/14/2018 19:42    Microbiology: No results found for this or any previous visit (from the past 240 hour(s)).   Labs: Basic Metabolic Panel: Recent Labs  Lab 03/14/18 1912 03/14/18 1921 03/15/18 0428  NA 134* 135  --   K 4.7 4.7  --   CL 102 101  --   CO2 24  --   --   GLUCOSE 328* 324*  --   BUN 16 17  --   CREATININE 0.90 0.80 0.73  CALCIUM 9.1  --   --    Liver Function Tests: Recent Labs  Lab 03/14/18 1912  AST 21  ALT 23  ALKPHOS 96  BILITOT 0.4  PROT 7.7  ALBUMIN 3.7   No results for input(s): LIPASE, AMYLASE in the last 168 hours. No results for input(s): AMMONIA in the last 168 hours. CBC: Recent Labs  Lab 03/14/18 1912  03/17/18 0612 03/18/18 0418 03/19/18 0617 03/20/18 0529 03/21/18 0501  WBC 8.4   < > 6.5 9.7 6.4 6.6 6.4  NEUTROABS 4.9  --   --   --   --   --   --   HGB 12.5*   < > 12.3* 11.8* 12.1* 11.8* 11.8*  HCT 38.8*   < > 41.1 39.8 40.1 39.7 39.0  MCV 70.9*   < > 72.0* 73.2* 72.1* 72.7* 72.5*  PLT 339   < > 329 304 304 326 326   < > = values in this interval not displayed.   Cardiac Enzymes: No results for input(s): CKTOTAL, CKMB, CKMBINDEX, TROPONINI in the last 168 hours. BNP: BNP (last 3 results) No results for input(s): BNP in the last 8760 hours.  ProBNP (last 3 results) No results for input(s): PROBNP in the last 8760 hours.  CBG: Recent Labs  Lab 03/20/18 0614 03/20/18 1129 03/20/18 1618 03/20/18 2156 03/21/18 0622  GLUCAP 198* 148* 152* 159* 139*       Signed:  Nita Sells MD   [facility] 03/21/2018, 7:43 AM

## 2018-03-24 ENCOUNTER — Inpatient Hospital Stay
Admit: 2018-03-24 | Discharge: 2018-03-24 | Disposition: A | Discharge status: Home / Self Care | Payer: Self-pay | Attending: Emergency Medicine

## 2018-03-24 DIAGNOSIS — Z760 Encounter for issue of repeat prescription: Secondary | ICD-10-CM

## 2018-03-24 LAB — POC PT/INR
INR (POC): 1.9 — ABNORMAL HIGH (ref 0.9–1.2)
Prothrombin time (POC): 21.6 SECS — ABNORMAL HIGH (ref 9.6–11.6)

## 2018-03-24 LAB — POCT PT/INR
POC INR: 1.9 — ABNORMAL HIGH (ref 0.9–1.2)
POC Protime: 21.6 SECS — ABNORMAL HIGH (ref 9.6–11.6)

## 2018-03-24 MED ORDER — INSULIN NPH/INSULIN REGULAR (70/30) 100 UNIT/ML INJECTION
100 unit/mL (70-30) | SUBCUTANEOUS | 2 refills | Status: AC
Start: 2018-03-24 — End: ?

## 2018-03-24 MED ORDER — ENOXAPARIN 80 MG/0.8 ML SUB-Q SYRINGE
80 mg/0.8 mL | INJECTION | Freq: Two times a day (BID) | SUBCUTANEOUS | 0 refills | Status: AC
Start: 2018-03-24 — End: 2018-03-29

## 2018-03-24 NOTE — Progress Notes (Signed)
MD recommend that he come back to the emergency room tomorrow for INR check.    Case management provided the patient a voucher for Lovenox and insulin.      SW explained to patient that he needed to be at the pharmacy today before 5. He explained that he had transportation to the pharmacy.

## 2018-03-24 NOTE — ED Provider Notes (Signed)
HPI:  Patient is here with request for medication refill since he ran out of them and could not afford it.  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  Discharged on September 27.  Was told he need to continue to take Lovenox and Coumadin until he is therapeutic on Coumadin.  Patient was given 3 Rijos of Lovenox.  Was given a prescription for 5 additional Braun of Lovenox.  Was told on Monday to go to [facility].  He went to the clinic on Monday and was unable to get into see anybody.  He used Lovenox twice a day for the past 3 Rotunno as recommended.  He is also taking Coumadin every night as prescribed.  He denies any worsening stroke symptoms.  Denies any new onset weakness tingling or numbness.  Needs help with follow-up as well as medications since he cannot afford them.  He was also asked to start on insulin and was given a prescription of it however he cannot afford it and they did not sign the prescription so he is unable to get it.    ROS  Constitutional: No fever, no chills  Skin: no rash  Eye: No vision changes  ENMT: No sore throat  Respiratory: No shortness of breath, no cough  Cardiovascular: No chest pain, no palpitations  Gastrointestinal: No vomiting, no nausea, no diarrhea, no abdominal pain  GU: No dysuria  MSK: No back pain, no muscle pain, no joint pain  Neuro: No headache, no change in mental status, no numbness, no tingling, no weakness  Psych:   Endocrine:   All other review of systems positive per history of present illness and the above otherwise negative or noncontributory.    Visit Vitals  BP 125/80 (BP 1 Location: Right arm)   Pulse (!) 101   Temp 97.6 ??F (36.4 ??C)   Resp 18   Ht '5\' 6"'$  (1.676 m)   Wt 74.8 kg (165 lb)   SpO2 99%   BMI 26.63 kg/m??     Past Medical History:   Diagnosis Date   ??? Diabetes ([place])    ??? Hypertension      No past surgical history on file.   Prior to Admission Medications   Prescriptions Last Dose Informant Patient Reported? Taking?   Blood-Glucose Meter monitoring kit   No No   Sig: Check blood sugar daily   glyBURIDE (DIABETA) 5 mg tablet   No No   Sig: Take 1 Tab by mouth Daily (before breakfast).      Facility-Administered Medications: None         Adult Exam   General: alert, no acute distress  Head: normocephalic, atraumatic  ENT: moist mucous membranes  Neck: supple, non-tender; full range of motion  Cardiovascular:  normal peripheral perfusion, no edema  Respiratory:  normal respirations;   Gastrointestinal: soft, non-tender  Back: non-tender, full range of motion  Musculoskeletal: normal range of motion, normal strength, no gross deformities  Neurological: alert and oriented x 4, no gross focal deficits; normal speech  Psychiatric: cooperative; appropriate mood and affect    MDM:  He has no complaint at this time.  No pain.  Exam is fairly unremarkable.  Spoke with case management.  They will try to get him into a clinic.  INR check of 1.9 today.  We will give a prescription for Lovenox 80 mg twice daily for 5 additional Kallam however I  suspect once he take his Coumadin tonight he should be therapeutic by tomorrow.  I recommend that he come back to the emergency room tomorrow for INR check.  Case management was able to give him voucher for Lovenox and insulin.  He is otherwise nontoxic.  He is stable for discharge at this time.         No results found.  Recent Results (from the past 24 hour(s))   POC PT/INR    Collection Time: 03/24/18 12:46 PM   Result Value Ref Range    Prothrombin time (POC) 21.6 (H) 9.6 - 11.6 SECS    INR (POC) 1.9 (H) 0.9 - 1.2           Dragon voice recognition software was used to create this note. Although the note has been reviewed and corrected where necessary, additional errors may have been overlooked and remain in the text.

## 2018-03-24 NOTE — ED Triage Notes (Signed)
Pt states he recently had a stroke a few Esguerra ago but is currently out of his blood thinner.

## 2018-03-24 NOTE — ED Notes (Signed)
I have reviewed discharge instructions with the patient.  The patient verbalized understanding.    Patient left ED via Discharge Method: ambulatory to Home with self.       Opportunity for questions and clarification provided.       Patient given 2 scripts.         To continue your aftercare when you leave the hospital, you may receive an automated call from our care team to check in on how you are doing.  This is a free service and part of our promise to provide the best care and service to meet your aftercare needs.??? If you have questions, or wish to unsubscribe from this service please call [phone].  Thank you for Choosing our [place] [facility].

## 2018-03-24 NOTE — ED Notes (Signed)
Pt states he recently had a stroke a few [place] ago but is currently out of his blood thinner.

## 2018-03-24 NOTE — ED Notes (Signed)
 I have reviewed discharge instructions with the patient.  The patient verbalized understanding.    Patient left ED via Discharge Method: ambulatory to Home with self.    Opportunity for questions and clarification provided.       Patient given 2 scripts.         To continue your aftercare when you leave the hospital, you may receive an automated call from our care team to check in on how you are doing.  This is a free service and part of our promise to provide the best care and service to meet your aftercare needs." If you have questions, or wish to unsubscribe from this service please call [phone].  Thank you for Choosing our [facility] Emergency Department.

## 2018-03-24 NOTE — ED Provider Notes (Signed)
HPI:  Patient is here with request for medication refill since he ran out of them and could not afford it.  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  Discharged on September 27.  Was told he need to continue to take Lovenox and Coumadin until he is therapeutic on Coumadin.  Patient was given 3 Scaffidi of Lovenox.  Was given a prescription for 5 additional Henderson of Lovenox.  Was told on Monday to go to [facility].  He went to the clinic on Monday and was unable to get into see anybody.  He used Lovenox twice a day for the past 3 Leib as recommended.  He is also taking Coumadin every night as prescribed.  He denies any worsening stroke symptoms.  Denies any new onset weakness tingling or numbness.  Needs help with follow-up as well as medications since he cannot afford them.  He was also asked to start on insulin and was given a prescription of it however he cannot afford it and they did not sign the prescription so he is unable to get it.    ROS  Constitutional: No fever, no chills  Skin: no rash  Eye: No vision changes  ENMT: No sore throat  Respiratory: No shortness of breath, no cough  Cardiovascular: No chest pain, no palpitations  Gastrointestinal: No vomiting, no nausea, no diarrhea, no abdominal pain  GU: No dysuria  MSK: No back pain, no muscle pain, no joint pain  Neuro: No headache, no change in mental status, no numbness, no tingling, no weakness  Psych:   Endocrine:   All other review of systems positive per history of present illness and the above otherwise negative or noncontributory.    Visit Vitals  BP 125/80 (BP 1 Location: Right arm)   Pulse (!) 101   Temp 97.6 ??F (36.4 ??C)   Resp 18   Ht '5\' 6"'$  (1.676 m)   Wt 74.8 kg (165 lb)   SpO2 99%   BMI 26.63 kg/m??     Past Medical History:   Diagnosis Date   ??? Diabetes ([place])    ??? Hypertension      No past surgical history on file.  Prior to Admission Medications    Prescriptions Last Dose Informant Patient Reported? Taking?   Blood-Glucose Meter monitoring kit   No No   Sig: Check blood sugar daily   glyBURIDE (DIABETA) 5 mg tablet   No No   Sig: Take 1 Tab by mouth Daily (before breakfast).      Facility-Administered Medications: None         Adult Exam   General: alert, no acute distress  Head: normocephalic, atraumatic  ENT: moist mucous membranes  Neck: supple, non-tender; full range of motion  Cardiovascular:  normal peripheral perfusion, no edema  Respiratory:  normal respirations;   Gastrointestinal: soft, non-tender  Back: non-tender, full range of motion  Musculoskeletal: normal range of motion, normal strength, no gross deformities  Neurological: alert and oriented x 4, no gross focal deficits; normal speech  Psychiatric: cooperative; appropriate mood and affect    MDM:  He has no complaint at this time.  No pain.  Exam is fairly unremarkable.  Spoke with case management.  They will try to get him into a clinic.  INR check of 1.9 today.  We will give a prescription for Lovenox 80 mg twice daily for 5 additional Murata however I  suspect once he take his Coumadin tonight he should be therapeutic by tomorrow.  I recommend that he come back to the emergency room tomorrow for INR check.  Case management was able to give him voucher for Lovenox and insulin.  He is otherwise nontoxic.  He is stable for discharge at this time.         No results found.  Recent Results (from the past 24 hour(s))   POC PT/INR    Collection Time: 03/24/18 12:46 PM   Result Value Ref Range    Prothrombin time (POC) 21.6 (H) 9.6 - 11.6 SECS    INR (POC) 1.9 (H) 0.9 - 1.2           Dragon voice recognition software was used to create this note. Although the note has been reviewed and corrected where necessary, additional errors may have been overlooked and remain in the text.

## 2018-03-24 NOTE — Progress Notes (Signed)
MD recommend that he come back to the emergency room tomorrow for INR check.    Case management provided the patient a voucher for Lovenox and insulin.      SW explained to patient that he needed to be at the pharmacy today before 5. He explained that he had transportation to the pharmacy.

## 2018-03-25 ENCOUNTER — Inpatient Hospital Stay
Admit: 2018-03-25 | Discharge: 2018-03-25 | Disposition: A | Discharge status: Home / Self Care | Payer: Self-pay | Attending: Emergency Medicine

## 2018-03-25 DIAGNOSIS — R791 Abnormal coagulation profile: Secondary | ICD-10-CM

## 2018-03-25 LAB — POC PT/INR
INR (POC): 1.4 — ABNORMAL HIGH (ref 0.9–1.2)
Prothrombin time (POC): 17 SECS — ABNORMAL HIGH (ref 9.6–11.6)

## 2018-03-25 LAB — POCT PT/INR
POC INR: 1.4 — ABNORMAL HIGH (ref 0.9–1.2)
POC Protime: 17 SECS — ABNORMAL HIGH (ref 9.6–11.6)

## 2018-03-25 NOTE — ED Notes (Signed)
I have reviewed discharge instructions with the patient.  The patient verbalized understanding.    Patient left ED via Discharge Method: ambulatory to Home with (SELF).    Opportunity for questions and clarification provided.       Patient given 0 scripts.         To continue your aftercare when you leave the hospital, you may receive an automated call from our care team to check in on how you are doing.  This is a free service and part of our promise to provide the best care and service to meet your aftercare needs.??? If you have questions, or wish to unsubscribe from this service please call [phone].  Thank you for Choosing our [place] Emergency Department.

## 2018-03-25 NOTE — ED Triage Notes (Signed)
Pt to er to get his blood coumadin level checked

## 2018-03-25 NOTE — Progress Notes (Signed)
SW has set-up for patient to meet with Brandy the Nurse Liaison with [employer]. She will see the patient today and start his paperwork to get him in with the free clinic tomorrow. SW explained to patient the process he is agreeable. SW will cab patient to his home to retrieve his paperwork and documents.

## 2018-03-25 NOTE — ED Provider Notes (Signed)
HPI:  Patient came back for INR check.  I saw him yesterday.  He was placed on Coumadin and Lovenox.  Not therapeutic yesterday.  Does not have a primary care doctor.  We are trying to arrange INR clinic and primary care doctors to help manage Coumadin  He has no new complaints.  Denies any chest pain, shortness of breath, stroke symptoms.  Took his Coumadin last night.  Also injected Lovenox last night and this morning with the prescription we had provided    ROS  No fever, cough or any new complaints    Visit Vitals  BP 119/67   Pulse 90   Temp 98.5 ??F (36.9 ??C)   Resp 18   SpO2 100%     Past Medical History:   Diagnosis Date   ??? Diabetes ([place])    ??? Hypertension      History reviewed. No pertinent surgical history.  Prior to Admission Medications   Prescriptions Last Dose Informant Patient Reported? Taking?   Blood-Glucose Meter monitoring kit   No No   Sig: Check blood sugar daily   enoxaparin (LOVENOX) 80 mg/0.8 mL injection   No No   Sig: 80 mg by SubCUTAneous route every twelve (12) hours for 5 Davids.   glyBURIDE (DIABETA) 5 mg tablet   No No   Sig: Take 1 Tab by mouth Daily (before breakfast).   insulin NPH/insulin regular (HUMULIN 70/30 U-100 INSULIN) 100 unit/mL (70-30) injection   No No   Sig: Inject 10 units (1 mL) into the skin 2 (two) times daily with a meal  Indications: type 2 diabetes mellitus      Facility-Administered Medications: None         Adult Exam   General: alert, no acute distress  Head: normocephalic  ENT: moist mucous membranes  Neck:  full range of motion  Cardiovascular:   Respiratory:  normal respirations  Gastrointestinal:   Back:  full range of motion  Musculoskeletal: normal range of motion, normal strength, no gross deformities  Neurological: alert and oriented x 4, no gross focal deficits; normal speech  Psychiatric: cooperative; appropriate mood and affect    MDM: INR today is 1.4.  Yesterday was 1.9.  Unsure why he is not therapeutic.   He is asymptomatic.  Will need to arrange Coumadin clinic or outpatient follow-up.     1:09 PM  Case management was able to successfully get a hold of someone from free clinic.  They will meet with him today and he will see her physician tomorrow.  We will send him directly there.  They will continue to follow-up with his Coumadin.  He is aware he needs to continue Lovenox and he is not therapeutic at this time.    No results found.  Recent Results (from the past 24 hour(s))   POC PT/INR    Collection Time: 03/24/18 12:46 PM   Result Value Ref Range    Prothrombin time (POC) 21.6 (H) 9.6 - 11.6 SECS    INR (POC) 1.9 (H) 0.9 - 1.2     POC PT/INR    Collection Time: 03/25/18 11:25 AM   Result Value Ref Range    Prothrombin time (POC) 17.0 (H) 9.6 - 11.6 SECS    INR (POC) 1.4 (H) 0.9 - 1.2           Dragon voice recognition software was used to create this note. Although the note has been reviewed and corrected where necessary, additional errors may have been  overlooked and remain in the text.

## 2018-03-25 NOTE — ED Notes (Signed)
PT REPORTS NEEDING COUMADIN LAB VALUES CHECKED

## 2018-03-25 NOTE — ED Notes (Signed)
Pt to er to get his blood coumadin level checked

## 2018-03-25 NOTE — Progress Notes (Signed)
SW has set-up for patient to meet with Gearldine BienenstockBrandy the Nurse Liaison with [employer]. She will see the patient today and start his paperwork to get him in with the free clinic tomorrow. SW explained to patient the process he is agreeable. SW will cab patient to his home to retrieve his paperwork and documents.

## 2018-03-25 NOTE — ED Notes (Signed)
I have reviewed discharge instructions with the patient.  The patient verbalized understanding.    Patient left ED via Discharge Method: ambulatory to Home with SELF.    Opportunity for questions and clarification provided.       Patient given 0 scripts.         To continue your aftercare when you leave the hospital, you may receive an automated call from our care team to check in on how you are doing.  This is a free service and part of our promise to provide the best care and service to meet your aftercare needs." If you have questions, or wish to unsubscribe from this service please call [phone].  Thank you for Choosing our  [place] [facility].

## 2018-03-25 NOTE — ED Notes (Signed)
PT REPORTS NEEDING COUMADIN LAB VALUES CHECKED

## 2018-03-25 NOTE — ED Provider Notes (Signed)
HPI:  Patient came back for INR check.  I saw him yesterday.  He was placed on Coumadin and Lovenox.  Not therapeutic yesterday.  Does not have a primary care doctor.  We are trying to arrange INR clinic and primary care doctors to help manage Coumadin  He has no new complaints.  Denies any chest pain, shortness of breath, stroke symptoms.  Took his Coumadin last night.  Also injected Lovenox last night and this morning with the prescription we had provided    ROS  No fever, cough or any new complaints    Visit Vitals  BP 119/67   Pulse 90   Temp 98.5 ??F (36.9 ??C)   Resp 18   SpO2 100%     Past Medical History:   Diagnosis Date   ??? Diabetes ([facility])    ??? Hypertension      History reviewed. No pertinent surgical history.  Prior to Admission Medications   Prescriptions Last Dose Informant Patient Reported? Taking?   Blood-Glucose Meter monitoring kit   No No   Sig: Check blood sugar daily   enoxaparin (LOVENOX) 80 mg/0.8 mL injection   No No   Sig: 80 mg by SubCUTAneous route every twelve (12) hours for 5 Herbold.   glyBURIDE (DIABETA) 5 mg tablet   No No   Sig: Take 1 Tab by mouth Daily (before breakfast).   insulin NPH/insulin regular (HUMULIN 70/30 U-100 INSULIN) 100 unit/mL (70-30) injection   No No   Sig: Inject 10 units (1 mL) into the skin 2 (two) times daily with a meal  Indications: type 2 diabetes mellitus      Facility-Administered Medications: None         Adult Exam   General: alert, no acute distress  Head: normocephalic  ENT: moist mucous membranes  Neck:  full range of motion  Cardiovascular:   Respiratory:  normal respirations  Gastrointestinal:   Back:  full range of motion  Musculoskeletal: normal range of motion, normal strength, no gross deformities  Neurological: alert and oriented x 4, no gross focal deficits; normal speech  Psychiatric: cooperative; appropriate mood and affect    MDM: INR today is 1.4.  Yesterday was 1.9.  Unsure why he is not therapeutic.  He is asymptomatic.  Will need to arrange  Coumadin clinic or outpatient follow-up.     1:09 PM  Case management was able to successfully get a hold of someone from free clinic.  They will meet with him today and he will see her physician tomorrow.  We will send him directly there.  They will continue to follow-up with his Coumadin.  He is aware he needs to continue Lovenox and he is not therapeutic at this time.    No results found.  Recent Results (from the past 24 hour(s))   POC PT/INR    Collection Time: 03/24/18 12:46 PM   Result Value Ref Range    Prothrombin time (POC) 21.6 (H) 9.6 - 11.6 SECS    INR (POC) 1.9 (H) 0.9 - 1.2     POC PT/INR    Collection Time: 03/25/18 11:25 AM   Result Value Ref Range    Prothrombin time (POC) 17.0 (H) 9.6 - 11.6 SECS    INR (POC) 1.4 (H) 0.9 - 1.2           Dragon voice recognition software was used to create this note. Although the note has been reviewed and corrected where necessary, additional errors may have been  overlooked and remain in the text.

## 2019-09-20 IMAGING — MR MR HEAD W/O CM
10 of 11 series · 42 of 48 positions shown · non-contrast
Comparison: 03/14/2018 CT head and CTA head.

CLINICAL DATA: 57 y/o M; sudden onset of right upper extremity
numbness and weakness.

EXAM:
MRI HEAD WITHOUT CONTRAST
TECHNIQUE: Multiplanar, multiecho pulse sequences of the brain and surrounding
structures were obtained without intravenous contrast.

[Series 5: DWI · axial · 4.0mm · 0.92mm/px · z∈[-60,+79]mm · 7 of 72 slices shown (1 of 2)]
[im 1/72]
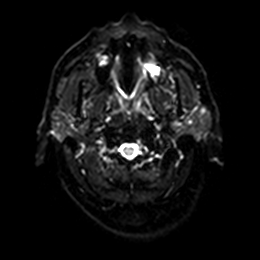
[im 12/72]
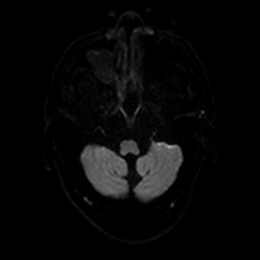
[im 24/72]
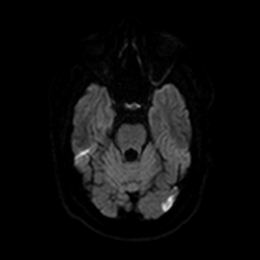
[im 36/72]
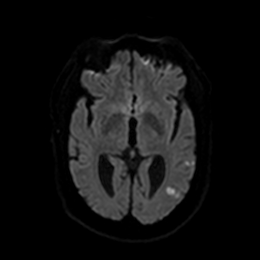
[im 48/72]
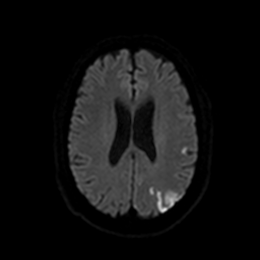
[im 60/72]
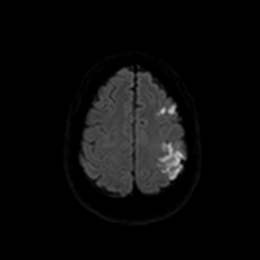
[im 72/72]
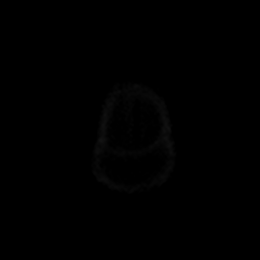

[Series 6: DWI · axial · 4.0mm · 0.92mm/px · z∈[-60,+79]mm · 4 of 36 slices shown (2 of 2)]
[im 1/36]
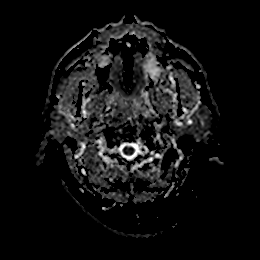
[im 12/36]
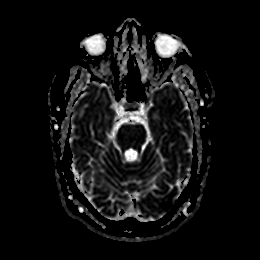
[im 24/36]
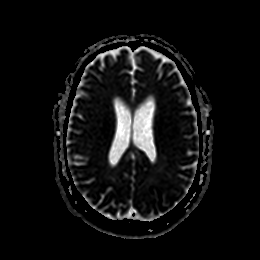
[im 36/36]
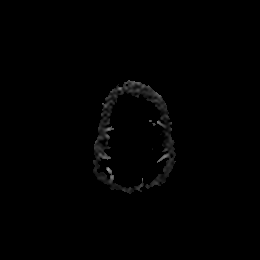

[Series 7: cor dwi_tracew · coronal · 5.0mm · 1.44mm/px · 6 of 64 slices shown]
[im 1/64]
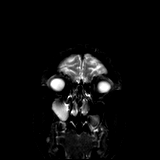
[im 13/64]
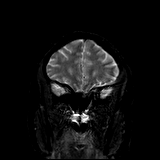
[im 26/64]
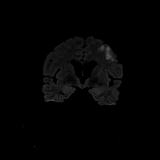
[im 38/64]
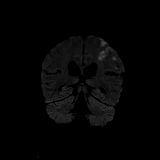
[im 51/64]
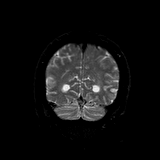
[im 64/64]
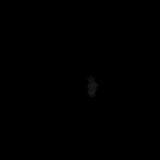

[Series 8: cor dwi_adc · coronal · 5.0mm · 1.44mm/px · 3 of 32 slices shown]
[im 1/32]
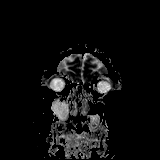
[im 16/32]
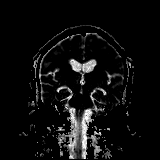
[im 32/32]
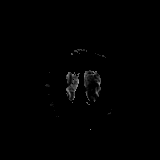

[Series 9: T1 · sagittal · 5.0mm · 0.75mm/px · 2 of 23 slices shown]
[im 1/23]
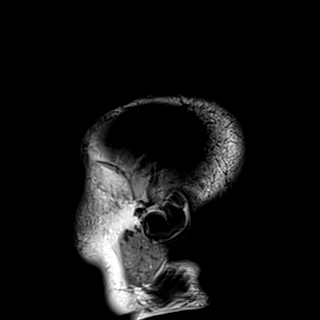
[im 23/23]
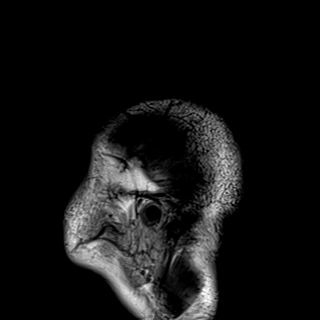

[Series 10: T2 · axial · 5.0mm · 0.72mm/px · z∈[-60,+82]mm · 3 of 25 slices shown (1 of 2)]
[im 1/25]
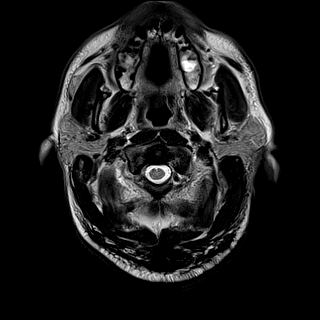
[im 13/25]
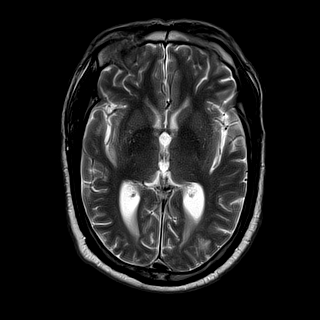
[im 25/25]
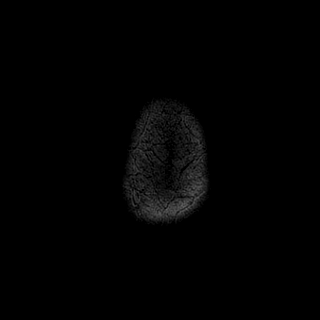

[Series 11: FLAIR · axial · 5.0mm · 0.45mm/px · z∈[-61,+81]mm · 3 of 25 slices shown]
[im 1/25]
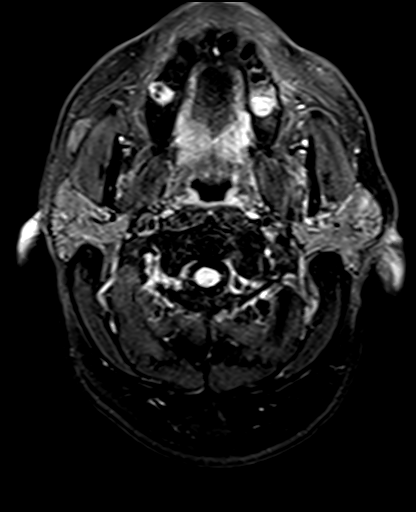
[im 13/25]
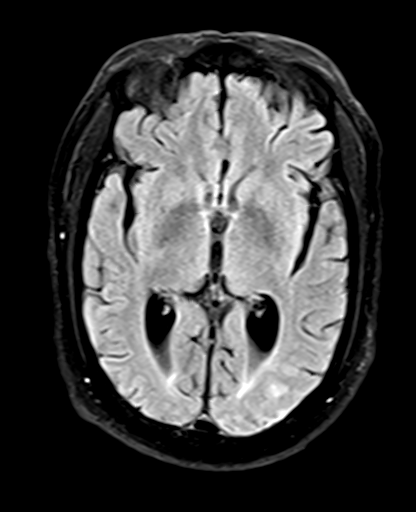
[im 25/25]
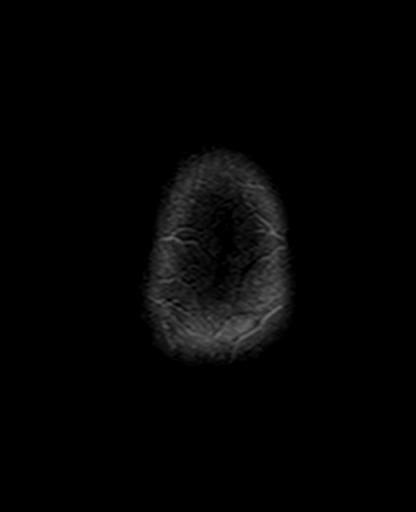

[Series 12: swi_images · axial · 3.0mm · 0.90mm/px · z∈[-72,+103]mm · 6 of 60 slices shown]
[im 1/60]
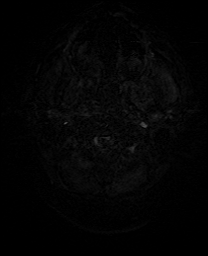
[im 12/60]
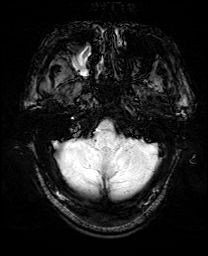
[im 24/60]
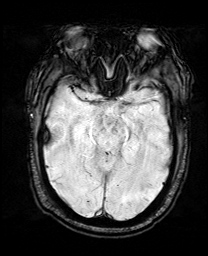
[im 36/60]
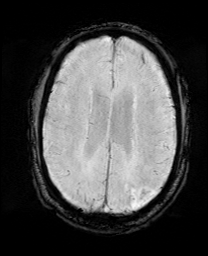
[im 48/60]
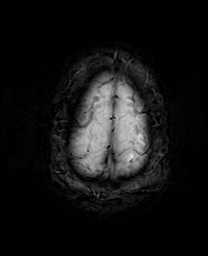
[im 60/60]
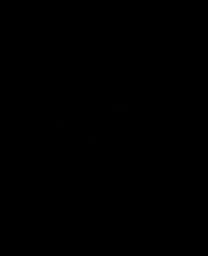

[Series 13: mip_images(sw) · axial · 24.0mm · 0.90mm/px · z∈[-62,+92]mm · 5 of 53 slices shown]
[im 1/53]
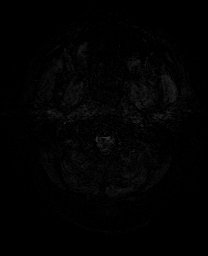
[im 14/53]
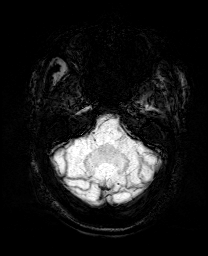
[im 27/53]
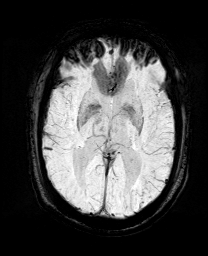
[im 40/53]
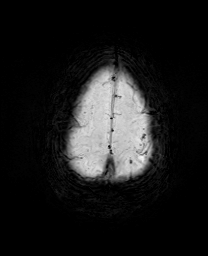
[im 53/53]
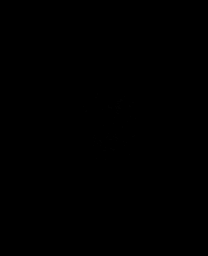

[Series 15: T2 · coronal · 5.0mm · 0.34mm/px · 3 of 29 slices shown (2 of 2)]
[im 1/29]
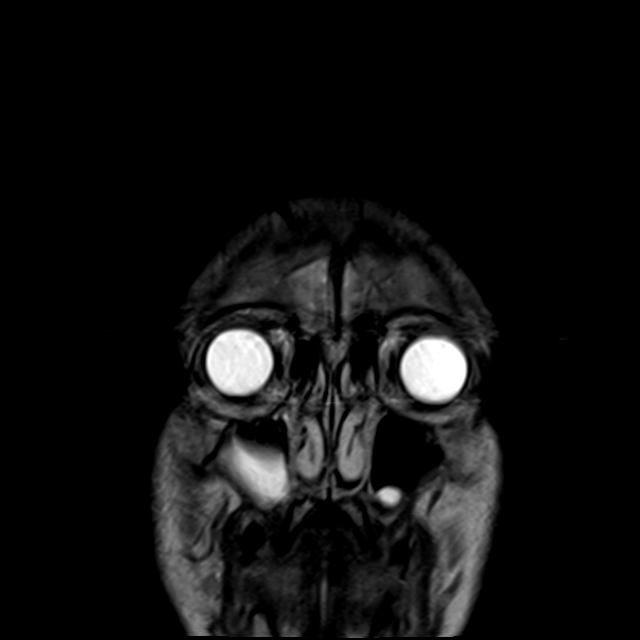
[im 15/29]
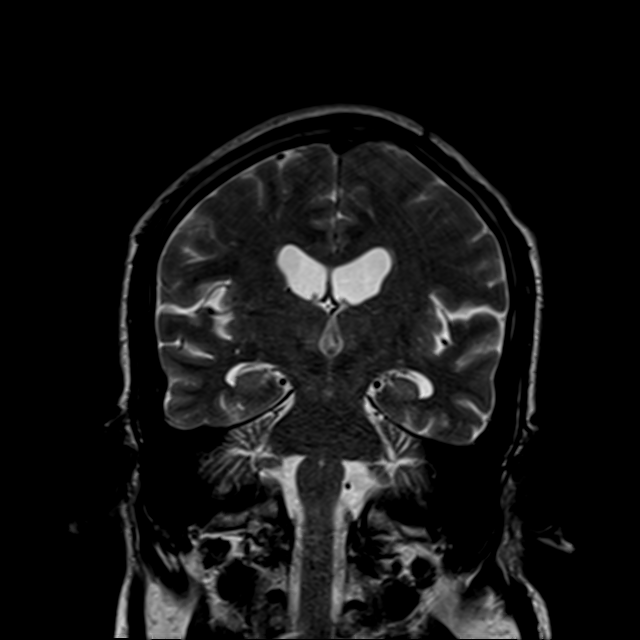
[im 29/29]
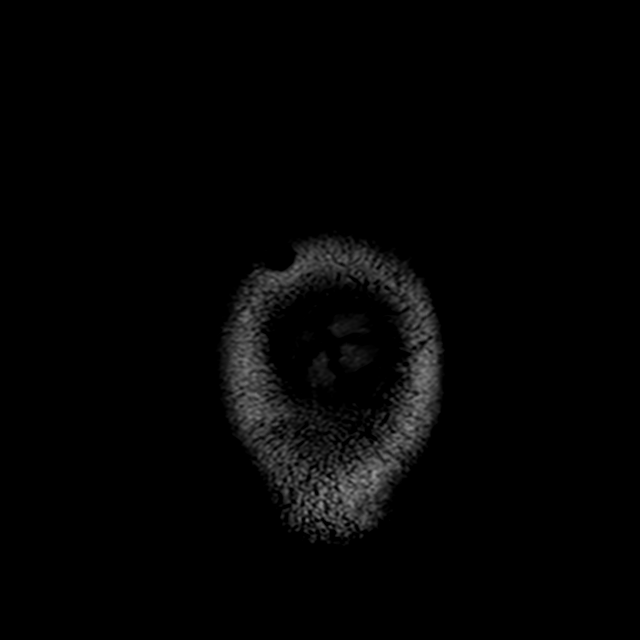

[42 of 48 positions shown; findings below may reference images not displayed]

FINDINGS: Brain: Several foci of reduced diffusion are present throughout the
left MCA distribution, predominantly posteriorly, compatible with
acute/early subacute infarction. 2 punctate foci are also present
within the left occipital lobe. No associated mass effect. There are
several foci of susceptibility hypointensity scattered throughout
the region of infarction compatible with petechial hemorrhage.

No extra-axial collection, hydrocephalus, effacement of basilar
cisterns, or herniation. Mild volume loss of the brain.

Vascular: Normal flow voids.

Skull and upper cervical spine: Normal marrow signal.

Sinuses/Orbits: Mild diffuse paranasal sinus mucosal thickening and
large maxillary sinus mucous retention cyst. Aerosolized secretions
within the right maxillary sinus may represent acute sinusitis. No
abnormal signal of the mastoid air cells.

Other: None.
IMPRESSION: 1. Several foci of acute/early subacute infarction are present
throughout the left MCA distribution predominantly posteriorly.
Positive for petechial hemorrhage. No mass effect.
2. Mild volume loss of the brain.
3. Paranasal sinus disease with right maxillary aerosolized
secretions which may represent acute sinusitis.

These results will be called to the ordering clinician or
representative by the Radiologist Assistant, and communication
documented in the PACS or zVision Dashboard.

By: Riswanto Tummala M.D.
